# Patient Record
Sex: Female | Born: 1995 | Race: White | Hispanic: No | Marital: Single | Smoking: Never smoker
Health system: Southern US, Community
[De-identification: ages and names within clinical notes are randomized; demographics above are authoritative.]

## PROBLEM LIST (undated history)

## (undated) DIAGNOSIS — E559 Vitamin D deficiency, unspecified: Secondary | ICD-10-CM

## (undated) HISTORY — DX: Vitamin D deficiency, unspecified: E55.9

## (undated) HISTORY — PX: WISDOM TOOTH EXTRACTION: SHX21

---
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
## 2017-09-07 ENCOUNTER — Other Ambulatory Visit
Admission: RE | Admit: 2017-09-07 | Discharge: 2017-09-07 | Disposition: A | Discharge status: Home / Self Care | Payer: 59 | Source: Ambulatory Visit | Attending: Nurse Practitioner | Admitting: Nurse Practitioner

## 2017-09-07 DIAGNOSIS — R5383 Other fatigue: Secondary | ICD-10-CM | POA: Diagnosis present

## 2017-09-07 DIAGNOSIS — Z202 Contact with and (suspected) exposure to infections with a predominantly sexual mode of transmission: Secondary | ICD-10-CM | POA: Diagnosis not present

## 2017-09-07 DIAGNOSIS — D509 Iron deficiency anemia, unspecified: Secondary | ICD-10-CM | POA: Diagnosis not present

## 2017-09-07 DIAGNOSIS — Z91018 Allergy to other foods: Secondary | ICD-10-CM | POA: Diagnosis not present

## 2017-09-07 DIAGNOSIS — E559 Vitamin D deficiency, unspecified: Secondary | ICD-10-CM | POA: Diagnosis not present

## 2017-09-07 LAB — T4, FREE: FREE T4: 0.83 ng/dL (ref 0.61–1.12)

## 2017-09-07 LAB — FOLATE: Folate: 7.8 ng/mL (ref >5.9)

## 2017-09-07 LAB — VITAMIN B12: Vitamin B-12: 233 pg/mL (ref 180–914)

## 2017-09-07 LAB — LIPID PANEL
CHOL/HDL RATIO: 4.2 ratio
CHOLESTEROL: 150 mg/dL (ref 0–200)
HDL: 36 mg/dL — AB (ref >40)
LDL Cholesterol: 96 mg/dL (ref 0–99)
TRIGLYCERIDES: 89 mg/dL (ref <150)
VLDL: 18 mg/dL (ref 0–40)

## 2017-09-07 LAB — CBC
HEMATOCRIT: 45.8 % (ref 35.0–47.0)
Hemoglobin: 15.3 g/dL (ref 12.0–16.0)
MCH: 29.2 pg (ref 26.0–34.0)
MCHC: 33.4 g/dL (ref 32.0–36.0)
MCV: 87.6 fL (ref 80.0–100.0)
Platelets: 353 10*3/uL (ref 150–440)
RBC: 5.23 MIL/uL — ABNORMAL HIGH (ref 3.80–5.20)
RDW: 13.1 % (ref 11.5–14.5)
WBC: 9.8 10*3/uL (ref 3.6–11.0)

## 2017-09-07 LAB — COMPREHENSIVE METABOLIC PANEL
ALT: 34 U/L (ref 14–54)
ANION GAP: 11 (ref 5–15)
AST: 26 U/L (ref 15–41)
Albumin: 4 g/dL (ref 3.5–5.0)
Alkaline Phosphatase: 104 U/L (ref 38–126)
BILIRUBIN TOTAL: 0.5 mg/dL (ref 0.3–1.2)
BUN: 14 mg/dL (ref 6–20)
CO2: 22 mmol/L (ref 22–32)
Calcium: 8.8 mg/dL — ABNORMAL LOW (ref 8.9–10.3)
Chloride: 105 mmol/L (ref 101–111)
Creatinine, Ser: 0.78 mg/dL (ref 0.44–1.00)
Glucose, Bld: 90 mg/dL (ref 65–99)
POTASSIUM: 4.4 mmol/L (ref 3.5–5.1)
Sodium: 138 mmol/L (ref 135–145)
TOTAL PROTEIN: 7.1 g/dL (ref 6.5–8.1)

## 2017-09-07 LAB — TSH: TSH: 4.856 u[IU]/mL — ABNORMAL HIGH (ref 0.350–4.500)

## 2017-09-07 LAB — FERRITIN: FERRITIN: 37 ng/mL (ref 11–307)

## 2017-09-08 LAB — HIV ANTIBODY (ROUTINE TESTING W REFLEX): HIV Screen 4th Generation wRfx: NONREACTIVE

## 2017-09-08 LAB — RPR: RPR: NONREACTIVE

## 2017-09-10 ENCOUNTER — Other Ambulatory Visit
Admission: RE | Admit: 2017-09-10 | Discharge: 2017-09-10 | Disposition: A | Discharge status: Home / Self Care | Payer: 59 | Source: Ambulatory Visit | Attending: Nurse Practitioner | Admitting: Nurse Practitioner

## 2017-09-10 DIAGNOSIS — Z91018 Allergy to other foods: Secondary | ICD-10-CM | POA: Diagnosis present

## 2017-09-10 LAB — ALLERGENS(10) FOODS
F082-IgE Cheese, Mold Type: <0.1 kU/L
F245-Ige Egg, Whole: <0.1 kU/L
Milk IgE: <0.1 kU/L
Peanut IgE: <0.1 kU/L
Wheat IgE: <0.1 kU/L

## 2017-09-10 LAB — GLIA (IGA/G) + TTG IGA
ANTIGLIADIN ABS, IGA: 17 U (ref 0–19)
GLIADIN IGG: 2 U (ref 0–19)
Tissue Transglutaminase Ab, IgA: <2 U/mL (ref 0–3)

## 2017-09-10 LAB — HSV 1 AND 2 IGM ABS, INDIRECT: HSV 1 IgM Antibodies: <1:10 {titer}

## 2017-09-12 LAB — MISC LABCORP TEST (SEND OUT): LABCORP TEST CODE: 164922

## 2017-09-12 LAB — VITAMIN D 25 HYDROXY (VIT D DEFICIENCY, FRACTURES): VIT D 25 HYDROXY: 7.7 ng/mL — AB (ref 30.0–100.0)

## 2017-09-13 LAB — MISC LABCORP TEST (SEND OUT): Labcorp test code: 602989

## 2017-09-19 LAB — MISC LABCORP TEST (SEND OUT): LABCORP TEST CODE: 602628

## 2017-12-13 ENCOUNTER — Ambulatory Visit (INDEPENDENT_AMBULATORY_CARE_PROVIDER_SITE_OTHER): Payer: 59 | Admitting: Nurse Practitioner

## 2017-12-13 ENCOUNTER — Encounter: Payer: Self-pay | Admitting: Nurse Practitioner

## 2017-12-13 ENCOUNTER — Other Ambulatory Visit: Payer: Self-pay | Admitting: Nurse Practitioner

## 2017-12-13 ENCOUNTER — Other Ambulatory Visit: Payer: Self-pay | Admitting: Internal Medicine

## 2017-12-13 VITALS — BP 141/84 | HR 90 | Resp 16 | Ht 67.0 in | Wt 346.0 lb

## 2017-12-13 DIAGNOSIS — R300 Dysuria: Secondary | ICD-10-CM

## 2017-12-13 DIAGNOSIS — E559 Vitamin D deficiency, unspecified: Secondary | ICD-10-CM | POA: Diagnosis not present

## 2017-12-13 DIAGNOSIS — Z124 Encounter for screening for malignant neoplasm of cervix: Secondary | ICD-10-CM | POA: Diagnosis not present

## 2017-12-13 DIAGNOSIS — Z0001 Encounter for general adult medical examination with abnormal findings: Secondary | ICD-10-CM | POA: Diagnosis not present

## 2017-12-13 NOTE — Progress Notes (Signed)
[facility] [place], [place] [address]  Internal MEDICINE  Office Visit Note  Patient Name: Betty Fisher  [id number]  [id number]  Date of Service: 12/23/2017  No chief complaint on file.    The patient has no physical concerns or complaints today. She has normal menstrual cycles. Has been watching her diet. She has cut calorie intake and is participating in routine physical exercise.    Pt is here for routine health maintenance examination  Current Medication: Outpatient Encounter Medications as of 12/13/2017  Medication Sig  . Vitamin D, Ergocalciferol, (DRISDOL) 50000 units CAPS capsule Take 50,000 Units by mouth once a week.   No facility-administered encounter medications on file as of 12/13/2017.     Surgical History: Past Surgical History:  Procedure Laterality Date  . WISDOM TOOTH EXTRACTION      Medical History: No past medical history on file.  Family History: Family History  Problem Relation Age of Onset  . Hypertension Mother   . Hypothyroidism Mother   . Hypertension Father       Review of Systems  Constitutional: Negative for activity change, chills, fatigue and unexpected weight change.  HENT: Negative for congestion, postnasal drip, rhinorrhea, sneezing and sore throat.   Eyes: Negative.  Negative for redness.  Respiratory: Negative for cough, chest tightness and shortness of breath.   Cardiovascular: Negative for chest pain and palpitations.  Gastrointestinal: Negative for abdominal pain, constipation, diarrhea, nausea and vomiting.  Genitourinary: Negative for dysuria and frequency.       She reports her last menstrual cycle was between 3 and 4 weeks ago. It was norma.   Musculoskeletal: Negative for arthralgias, back pain, joint swelling and neck pain.  Skin: Negative for rash.  Allergic/Immunologic: Negative for environmental allergies.  Neurological: Negative.  Negative for tremors and numbness.  Hematological:  Negative for adenopathy. Does not bruise/bleed easily.  Psychiatric/Behavioral: Negative for behavioral problems (Depression), sleep disturbance and suicidal ideas. The patient is not nervous/anxious.      Today's Vitals   12/13/17 1144  BP: (!) 141/84  Pulse: 90  Resp: 16  SpO2: 98%  Weight: (!) 346 lb (156.9 kg)  Height: 5\' 7"  (1.702 m)    Physical Exam  Constitutional: She is oriented to person, place, and time. She appears well-developed and well-nourished. No distress.  HENT:  Head: Normocephalic and atraumatic.  Mouth/Throat: Oropharynx is clear and moist. No oropharyngeal exudate.  Eyes: EOM are normal. Pupils are equal, round, and reactive to light.  Neck: Normal range of motion. Neck supple. No JVD present. No tracheal deviation present. No thyromegaly present.  Cardiovascular: Normal rate, regular rhythm, normal heart sounds and intact distal pulses. Exam reveals no gallop and no friction rub.  No murmur heard. Pulmonary/Chest: Effort normal and breath sounds normal. No respiratory distress. She has no wheezes. She has no rales. She exhibits no tenderness. Right breast exhibits no inverted nipple, no mass, no nipple discharge, no skin change and no tenderness. Left breast exhibits no inverted nipple, no mass, no nipple discharge, no skin change and no tenderness.  Abdominal: Soft. Bowel sounds are normal. There is no tenderness. Hernia confirmed negative in the right inguinal area and confirmed negative in the left inguinal area.  Genitourinary: Rectum normal. Pelvic exam was performed with patient supine. There is no rash, tenderness, lesion or injury on the right labia. There is no rash, tenderness, lesion or injury on the left labia. Uterus is not deviated, not enlarged, not fixed and  not tender. Cervix exhibits no motion tenderness, no discharge and no friability. Right adnexum displays no mass, no tenderness and no fullness. Left adnexum displays no mass, no tenderness and no  fullness. No erythema, tenderness or bleeding in the vagina. No foreign body in the vagina. No signs of injury around the vagina. No vaginal discharge found.  Genitourinary Comments: There is no tenderness, masses, or organomegay present during bimanual exam.   Musculoskeletal: Normal range of motion.  Lymphadenopathy:    She has no cervical adenopathy.       Right: No inguinal adenopathy present.       Left: No inguinal adenopathy present.  Neurological: She is alert and oriented to person, place, and time. No cranial nerve deficit.  Skin: Skin is warm and dry. She is not diaphoretic.  Psychiatric: She has a normal mood and affect. Her behavior is normal. Judgment and thought content normal.  Nursing note and vitals reviewed.    LABS: Recent Results (from the past 2160 hour(s))  Urinalysis, Routine w reflex microscopic     Status: Abnormal   Collection Time: 12/13/17 11:40 AM  Result Value Ref Range   Specific Gravity, UA 1.024 1.005 - 1.030   pH, UA 7.0 5.0 - 7.5   Color, UA Yellow Yellow   Appearance Ur Clear Clear   Leukocytes, UA 1+ (A) Negative   Protein, UA Negative Negative/Trace   Glucose, UA Negative Negative   Ketones, UA Negative Negative   RBC, UA Negative Negative   Bilirubin, UA Negative Negative   Urobilinogen, Ur 0.2 0.2 - 1.0 mg/dL   Nitrite, UA Negative Negative   Microscopic Examination See below:     Comment: Microscopic was indicated and was performed.  Microscopic Examination     Status: Abnormal   Collection Time: 12/13/17 11:40 AM  Result Value Ref Range   WBC, UA 0-5 0 - 5 /hpf   RBC, UA 0-2 0 - 2 /hpf   Epithelial Cells (non renal) >10 (A) 0 - 10 /hpf   Casts None seen None seen /lpf   Mucus, UA Present Not Estab.   Bacteria, UA None seen None seen/Few  Pap IG and HPV (high risk) DNA detection     Status: Abnormal   Collection Time: 12/13/17 11:41 AM  Result Value Ref Range   DIAGNOSIS: Comment (A)     Comment: EPITHELIAL CELL  ABNORMALITY. ATYPICAL SQUAMOUS CELLS OF UNDETERMINED SIGNIFICANCE (ASC-US).    Specimen adequacy: Comment     Comment: Satisfactory for evaluation. No endocervical component is identified.   Clinician Provided ICD10 Comment     Comment: Z12.4   Performed by: Comment     Comment: Heloise Ochoa, Cytotechnologist (ASCP)   Electronically signed by: Comment     Comment: Franco Collet, MD, Pathologist   PAP Smear Comment .    PATHOLOGIST PROVIDED ICD10: Comment     Comment: R87.610   Note: Comment     Comment: The Pap smear is a screening test designed to aid in the detection of premalignant and malignant conditions of the uterine cervix.  It is not a diagnostic procedure and should not be used as the sole means of detecting cervical cancer.  Both false-positive and false-negative reports do occur.    Test Methodology Comment     Comment: This liquid based ThinPrep(R) pap test was screened with the use of an image guided system.    HPV, high-risk Positive (A) Negative    Comment: This high-risk HPV test detects  thirteen high-risk types (16/18/31/33/35/39/45/51/52/56/58/59/68) without differentiation.      Assessment/Plan:  1. Encounter for general adult medical examination with abnormal findings Annual health maintenance exam today  2. Morbid obesity ([place]) Discussed limiting calorie intake to 1500 calorie diet and to continue with routine exercise program.   3. Screening for malignant neoplasm of cervix - Pap IG and HPV (high risk) DNA detection  4. Dysuria - Urinalysis, Routine w reflex microscopic   General Counseling: Nykira verbalizes understanding of the findings of todays visit and agrees with plan of treatment. I have discussed any further diagnostic evaluation that may be needed or ordered today. We also reviewed her medications today. she has been encouraged to call the office with any questions or concerns that should arise related to todays visit.   This  patient was seen by Leretha Pol, FNP- C in Collaboration with Dr Lavera Guise as a part of collaborative care agreement    Orders Placed This Encounter  Procedures  . Microscopic Examination  . Urinalysis, Routine w reflex microscopic    Time spent: [place], MD  Internal Medicine

## 2017-12-14 LAB — MICROSCOPIC EXAMINATION
Bacteria, UA: NONE SEEN
CASTS: NONE SEEN /LPF

## 2017-12-14 LAB — URINALYSIS, ROUTINE W REFLEX MICROSCOPIC
Bilirubin, UA: NEGATIVE
Glucose, UA: NEGATIVE
Ketones, UA: NEGATIVE
Nitrite, UA: NEGATIVE
PH UA: 7 (ref 5.0–7.5)
PROTEIN UA: NEGATIVE
RBC, UA: NEGATIVE
Specific Gravity, UA: 1.024 (ref 1.005–1.030)
Urobilinogen, Ur: 0.2 mg/dL (ref 0.2–1.0)

## 2017-12-17 ENCOUNTER — Emergency Department
Admission: EM | Admit: 2017-12-17 | Discharge: 2017-12-17 | Disposition: A | Discharge status: Home / Self Care | Payer: 59 | Attending: Emergency Medicine | Admitting: Emergency Medicine

## 2017-12-17 ENCOUNTER — Encounter: Payer: Self-pay | Admitting: Emergency Medicine

## 2017-12-17 ENCOUNTER — Other Ambulatory Visit: Payer: Self-pay

## 2017-12-17 ENCOUNTER — Emergency Department: Payer: 59

## 2017-12-17 DIAGNOSIS — R0602 Shortness of breath: Secondary | ICD-10-CM | POA: Insufficient documentation

## 2017-12-17 DIAGNOSIS — J209 Acute bronchitis, unspecified: Secondary | ICD-10-CM | POA: Diagnosis not present

## 2017-12-17 DIAGNOSIS — R5383 Other fatigue: Secondary | ICD-10-CM | POA: Diagnosis not present

## 2017-12-17 DIAGNOSIS — R05 Cough: Secondary | ICD-10-CM | POA: Diagnosis not present

## 2017-12-17 MED ORDER — PREDNISONE 50 MG PO TABS: ORAL_TABLET | ORAL | 0 refills | Status: DC

## 2017-12-17 MED ORDER — AZITHROMYCIN 500 MG PO TABS
ORAL_TABLET | ORAL | Status: AC
  Filled 2017-12-17: qty 1

## 2017-12-17 MED ORDER — IPRATROPIUM-ALBUTEROL 0.5-2.5 (3) MG/3ML IN SOLN
3.0000 mL | Freq: Once | RESPIRATORY_TRACT | Status: AC
  Administered 2017-12-17: 3 mL via RESPIRATORY_TRACT
  Filled 2017-12-17: qty 3

## 2017-12-17 MED ORDER — AZITHROMYCIN 250 MG PO TABS: ORAL_TABLET | ORAL | 0 refills | Status: AC

## 2017-12-17 MED ORDER — AZITHROMYCIN 500 MG PO TABS
500.0000 mg | ORAL_TABLET | Freq: Once | ORAL | Status: AC
  Administered 2017-12-17: 500 mg via ORAL

## 2017-12-17 MED ORDER — PREDNISONE 20 MG PO TABS
60.0000 mg | ORAL_TABLET | Freq: Once | ORAL | Status: AC
  Administered 2017-12-17: 60 mg via ORAL
  Filled 2017-12-17: qty 3

## 2017-12-17 NOTE — ED Triage Notes (Signed)
Pt presents to ED with "deep cough" with fatigue. Onset Wednesday night. Pt states she is coughing all night and not able to get any sleep.

## 2017-12-17 NOTE — ED Provider Notes (Signed)
[facility] Emergency Department Provider Note  ____________________________________________  Time seen: Approximately 10:33 PM  I have reviewed the triage vital signs and the nursing notes.   HISTORY  Chief Complaint Fatigue and Cough    HPI Betty Fisher is a 22 y.o. female presents to the emergency department with cough, shortness of breath and fatigue after developing a productive cough for the past 5 days.  Patient reports that she is unable to sleep due to cough.  She denies chest pain, nausea and abdominal pain.  She has been afebrile at home.  No prior history of asthma.  No alleviating measures of been attempted.   History reviewed. No pertinent past medical history.  There are no active problems to display for this patient.   Past Surgical History:  Procedure Laterality Date  . WISDOM TOOTH EXTRACTION      Prior to Admission medications   Medication Sig Start Date End Date Taking? Authorizing Provider  azithromycin (ZITHROMAX Z-PAK) 250 MG tablet Take 2 tablets (500 mg) on  Day 1,  followed by 1 tablet (250 mg) once daily on Days 2 through 5. 12/17/17 12/22/17  Lannie Fields, PA-C  predniSONE (DELTASONE) 50 MG tablet Take one 50 mg tablet once a day for 5 days. 12/17/17   Lannie Fields, PA-C  Vitamin D, Ergocalciferol, (DRISDOL) 50000 units CAPS capsule Take 50,000 Units by mouth once a week. 11/30/17   [provider]    Allergies Patient has no known allergies.  Family History  Problem Relation Age of Onset  . Hypertension Mother   . Hypothyroidism Mother   . Hypertension Father     Social History Social History   Tobacco Use  . Smoking status: Never Smoker  . Smokeless tobacco: Never Used  Substance Use Topics  . Alcohol use: No    Frequency: Never  . Drug use: No     Review of Systems  Constitutional: No fever/chills Eyes: No visual changes. No discharge ENT: No upper respiratory complaints. Cardiovascular:  no chest pain. Respiratory: Patient has cough.  Gastrointestinal: No abdominal pain.  No nausea, no vomiting.  No diarrhea.  No constipation. Musculoskeletal: Negative for musculoskeletal pain. Skin: Negative for rash, abrasions, lacerations, ecchymosis. Neurological: Negative for headaches, focal weakness or numbness.  ____________________________________________   PHYSICAL EXAM:  VITAL SIGNS: ED Triage Vitals [12/17/17 2019]  Enc Vitals Group     BP (!) 147/82     Pulse Rate 84     Resp 18     Temp 99 F (37.2 C)     Temp Source Oral     SpO2 98 %     Weight (!) 346 lb (156.9 kg)     Height 5\' 7"  (1.702 m)     Head Circumference      Peak Flow      Pain Score 8     Pain Loc      Pain Edu?      Excl. in [place]?      Constitutional: Alert and oriented. Well appearing and in no acute distress. Eyes: Conjunctivae are normal. PERRL. EOMI. Head: Atraumatic. ENT:      Ears: TMs are pearly.       Nose: No congestion/rhinnorhea.      Mouth/Throat: Mucous membranes are moist.  Hematological/Lymphatic/Immunilogical: No cervical lymphadenopathy.  Cardiovascular: Normal rate, regular rhythm. Normal S1 and S2.  Good peripheral circulation. Respiratory: Normal respiratory effort without tachypnea or retractions. Lungs CTAB. Good air entry to  the bases with no decreased or absent breath sounds. Musculoskeletal: Full range of motion to all extremities. No gross deformities appreciated. Neurologic:  Normal speech and language. No gross focal neurologic deficits are appreciated.  Skin:  Skin is warm, dry and intact. No rash noted.  ____________________________________________   LABS (all labs ordered are listed, but only abnormal results are displayed)  Labs Reviewed - No data to display ____________________________________________  EKG   ____________________________________________  RADIOLOGY Unk Pinto, personally viewed and evaluated these images (plain radiographs)  as part of my medical decision making, as well as reviewing the written report by the radiologist.  Dg Chest 2 View  Result Date: 12/17/2017 CLINICAL DATA:  Cough and fatigue since Wednesday evening. EXAM: CHEST  2 VIEW COMPARISON:  None. FINDINGS: The heart size and mediastinal contours are within normal limits. Both lungs are clear. The visualized skeletal structures are unremarkable. IMPRESSION: No active cardiopulmonary disease. Electronically Signed   By: Ashley Royalty M.D.   On: 12/17/2017 21:18    ____________________________________________    PROCEDURES  Procedure(s) performed:    Procedures    Medications  azithromycin (ZITHROMAX) 500 MG tablet (not administered)  ipratropium-albuterol (DUONEB) 0.5-2.5 (3) MG/3ML nebulizer solution 3 mL (3 mLs Nebulization Given 12/17/17 2204)  azithromycin (ZITHROMAX) tablet 500 mg (500 mg Oral Given 12/17/17 2219)  predniSONE (DELTASONE) tablet 60 mg (60 mg Oral Given 12/17/17 2220)     ____________________________________________   INITIAL IMPRESSION / ASSESSMENT AND PLAN / ED COURSE  Pertinent labs & imaging results that were available during my care of the patient were reviewed by me and considered in my medical decision making (see chart for details).  Review of the [place] CSRS was performed in accordance of the [place] prior to dispensing any controlled drugs.    Assessment and Plan:  Acute Bronchitis:  Patient presents to the emergency department with productive cough and fatigue for the past 5 days.  Differential diagnosis included community-acquired pneumonia versus bronchitis.  No consolidations were found on chest x-ray.  Patient was treated empirically for acute bronchitis with azithromycin and prednisone.  Vital signs are reassuring prior to discharge.  All patient questions were answered.     ____________________________________________  FINAL CLINICAL IMPRESSION(S) / ED DIAGNOSES  Final diagnoses:  Acute bronchitis,  unspecified organism      NEW MEDICATIONS STARTED DURING THIS VISIT:  ED Discharge Orders        Ordered    azithromycin (ZITHROMAX Z-PAK) 250 MG tablet     12/17/17 2202    predniSONE (DELTASONE) 50 MG tablet     12/17/17 2202          This chart was dictated using voice recognition software/Dragon. Despite best efforts to proofread, errors can occur which can change the meaning. Any change was purely unintentional.    Lannie Fields, PA-C 12/17/17 2237    Nance Pear, MD 12/17/17 2245

## 2017-12-17 NOTE — ED Notes (Signed)
Coughing especially at night since Wednesday. Unable to rest and feels very fatigued

## 2017-12-18 LAB — PAP IG AND HPV HIGH-RISK
HPV, high-risk: POSITIVE — AB
PAP Smear Comment: 0

## 2017-12-23 ENCOUNTER — Encounter: Payer: Self-pay | Admitting: Nurse Practitioner

## 2017-12-23 DIAGNOSIS — E559 Vitamin D deficiency, unspecified: Secondary | ICD-10-CM | POA: Insufficient documentation

## 2018-01-07 ENCOUNTER — Ambulatory Visit: Payer: Self-pay | Admitting: Obstetrics and Gynecology

## 2018-01-16 ENCOUNTER — Emergency Department
Admission: EM | Admit: 2018-01-16 | Discharge: 2018-01-16 | Disposition: A | Discharge status: Home / Self Care | Payer: 59 | Attending: Emergency Medicine | Admitting: Emergency Medicine

## 2018-01-16 ENCOUNTER — Other Ambulatory Visit: Payer: Self-pay

## 2018-01-16 DIAGNOSIS — R05 Cough: Secondary | ICD-10-CM | POA: Diagnosis not present

## 2018-01-16 DIAGNOSIS — J101 Influenza due to other identified influenza virus with other respiratory manifestations: Secondary | ICD-10-CM | POA: Diagnosis not present

## 2018-01-16 DIAGNOSIS — R509 Fever, unspecified: Secondary | ICD-10-CM | POA: Diagnosis not present

## 2018-01-16 DIAGNOSIS — Z79899 Other long term (current) drug therapy: Secondary | ICD-10-CM | POA: Insufficient documentation

## 2018-01-16 LAB — INFLUENZA PANEL BY PCR (TYPE A & B)
INFLAPCR: POSITIVE — AB
Influenza B By PCR: NEGATIVE

## 2018-01-16 LAB — GROUP A STREP BY PCR

## 2018-01-16 MED ORDER — ACETAMINOPHEN 325 MG PO TABS
650.0000 mg | ORAL_TABLET | Freq: Once | ORAL | Status: AC | PRN
  Administered 2018-01-16: 650 mg via ORAL
  Filled 2018-01-16: qty 2

## 2018-01-16 MED ORDER — OSELTAMIVIR PHOSPHATE 75 MG PO CAPS: 75.0000 mg | ORAL_CAPSULE | Freq: Two times a day (BID) | ORAL | 0 refills | Status: DC

## 2018-01-16 NOTE — ED Provider Notes (Signed)
[facility] Emergency Department Provider Note  ____________________________________________   First MD Initiated Contact with Patient 01/16/18 2027     (approximate)  I have reviewed the triage vital signs and the nursing notes.   HISTORY  Chief Complaint Fever    HPI Betty Fisher is a 22 y.o. female who presents emergency department complaining of fever and chills.  She has had a cough and congestion.  She denies any vomiting or diarrhea.  She denies sore throat.  She recently had a upper respiratory infection in February.  She was treated with antibiotic at that time.  No past medical history on file.  Patient Active Problem List   Diagnosis Date Noted  . Morbid obesity ([place]) 12/23/2017  . Vitamin D deficiency 12/23/2017    Past Surgical History:  Procedure Laterality Date  . WISDOM TOOTH EXTRACTION      Prior to Admission medications   Medication Sig Start Date End Date Taking? Authorizing Provider  oseltamivir (TAMIFLU) 75 MG capsule Take 1 capsule (75 mg total) by mouth 2 (two) times daily. 01/16/18   Fisher, Linden Dolin, PA-C  predniSONE (DELTASONE) 50 MG tablet Take one 50 mg tablet once a day for 5 days. 12/17/17   Lannie Fields, PA-C  Vitamin D, Ergocalciferol, (DRISDOL) 50000 units CAPS capsule Take 50,000 Units by mouth once a week. 11/30/17   [provider]    Allergies Patient has no known allergies.  Family History  Problem Relation Age of Onset  . Hypertension Mother   . Hypothyroidism Mother   . Hypertension Father     Social History Social History   Tobacco Use  . Smoking status: Never Smoker  . Smokeless tobacco: Never Used  Substance Use Topics  . Alcohol use: No    Frequency: Never  . Drug use: No    Review of Systems  Constitutional: Positive fever/chills, positive for body aches Eyes: No visual changes. ENT: No sore throat. Respiratory: Positive cough Gastrointestinal: Denies vomiting or  diarrhea Genitourinary: Negative for dysuria. Musculoskeletal: Negative for back pain. Skin: Negative for rash.    ____________________________________________   PHYSICAL EXAM:  VITAL SIGNS: ED Triage Vitals [01/16/18 2007]  Enc Vitals Group     BP (!) 145/74     Pulse Rate (!) 126     Resp 20     Temp (!) 101.7 F (38.7 C)     Temp Source Oral     SpO2 97 %     Weight (!) 320 lb (145.2 kg)     Height 5\' 8"  (1.727 m)     Head Circumference      Peak Flow      Pain Score 0     Pain Loc      Pain Edu?      Excl. in [place]?     Constitutional: Alert and oriented. Well appearing and in no acute distress. Eyes: Conjunctivae are normal.  Head: Atraumatic. Nose: No congestion/rhinnorhea. Mouth/Throat: Mucous membranes are moist.  Throat is irritated Neck: Is supple, no lymphadenopathy is noted Cardiovascular: Normal rate, regular rhythm.  Heart sounds are normal Respiratory: Normal respiratory effort.  No retractions, lungs are clear to auscultation GU: deferred Musculoskeletal: FROM all extremities, warm and well perfused Neurologic:  Normal speech and language.  Skin:  Skin is warm, dry and intact. No rash noted. Psychiatric: Mood and affect are normal. Speech and behavior are normal.  ____________________________________________   LABS (all labs ordered are listed, but only  abnormal results are displayed)  Labs Reviewed  INFLUENZA PANEL BY PCR (TYPE A & B) - Abnormal; Notable for the following components:      Result Value   Influenza A By PCR POSITIVE (*)    All other components within normal limits  GROUP A STREP BY PCR   ____________________________________________   ____________________________________________  RADIOLOGY    ____________________________________________   PROCEDURES  Procedure(s) performed: No  Procedures    ____________________________________________   INITIAL IMPRESSION / ASSESSMENT AND PLAN / ED COURSE  Pertinent labs &  imaging results that were available during my care of the patient were reviewed by me and considered in my medical decision making (see chart for details).  Patient is a 22 year old female present emergency department complaining of fever and chills with body aches.  Symptoms started yesterday  On physical exam she appears nontoxic.  She is febrile.  The throat is mildly irritated.  But the remainder of the exam is benign  Flu test is positive for influenza A Strep test was inconclusive  Test results were discussed with patient.  She is given a prescription for Tamiflu.  Explained to her the Tamiflu does not cure the flu but it will help with symptoms.  Is up to her whether she has this medication or not.  She can also take over-the-counter TheraFlu.  She is to take Tylenol and ibuprofen to reduce her fever.  She is contagious until she has not had a fever for 24-48 hours.  Patient states she understands.  She see her regular doctor if not better in 3-5 days.  She is to return emergency department if worsening.  She was discharged in stable condition     As part of my medical decision making, I reviewed the following data within the [facility] notes reviewed and incorporated, Labs reviewed flu test is positive for A, strep test, Notes from prior ED visits and [place] Controlled Substance Database  ____________________________________________   FINAL CLINICAL IMPRESSION(S) / ED DIAGNOSES  Final diagnoses:  Influenza A      NEW MEDICATIONS STARTED DURING THIS VISIT:  New Prescriptions   OSELTAMIVIR (TAMIFLU) 75 MG CAPSULE    Take 1 capsule (75 mg total) by mouth 2 (two) times daily.     Note:  This document was prepared using Dragon voice recognition software and may include unintentional dictation errors.    Versie [place], PA-C 01/16/18 2303    Nance Pear, MD 01/16/18 2330

## 2018-01-16 NOTE — ED Triage Notes (Signed)
Patient to ER with c/o fever at home (102) with cough. Patient in no acute distress in triage.

## 2018-01-16 NOTE — Discharge Instructions (Signed)
Follow-up with your regular doctor if not better in 3-5 days.  Return emergency department if worsening.  Drink plenty of fluids.  Take Tylenol and ibuprofen as needed.  Over-the-counter TheraFlu will also help

## 2018-01-28 ENCOUNTER — Ambulatory Visit: Payer: Self-pay | Admitting: Obstetrics and Gynecology

## 2018-02-07 ENCOUNTER — Ambulatory Visit (INDEPENDENT_AMBULATORY_CARE_PROVIDER_SITE_OTHER): Payer: 59 | Admitting: Obstetrics and Gynecology

## 2018-02-07 ENCOUNTER — Encounter: Payer: Self-pay | Admitting: Obstetrics and Gynecology

## 2018-02-07 VITALS — BP 132/96 | HR 97 | Ht 67.0 in | Wt 338.0 lb

## 2018-02-07 DIAGNOSIS — R87610 Atypical squamous cells of undetermined significance on cytologic smear of cervix (ASC-US): Secondary | ICD-10-CM | POA: Diagnosis not present

## 2018-02-07 DIAGNOSIS — Z23 Encounter for immunization: Secondary | ICD-10-CM

## 2018-02-07 DIAGNOSIS — R87810 Cervical high risk human papillomavirus (HPV) DNA test positive: Secondary | ICD-10-CM

## 2018-02-07 NOTE — Progress Notes (Signed)
Obstetrics & Gynecology Office Visit   Chief Complaint:  Chief Complaint  Patient presents with  . Referred by [facility]    Abnormal  Pap    History of Present Illness:Betty Fisher is a 23 y.o. woman who presents today for consultation at the request of [name], MD [place] regarding recent pap smear results. Last pap obtained on 12/13/2017 revealed ASCUS HPV positive.  This was the patient's first pap.  The patient became sexually active in the last year.  So this likely represents a recent HPV exposure.  We discussed that for patient's under 24 year of age HPV is common, and that most individuals with a healthy immune system will clear HPV.   There is currently no FDA approved therapies to promote clearance of her current HPV infection, there is HPV vaccination options to prevent subsequent infection.  She has previously received 1 shot of the 3 shot Gardasil series and is interested in finishing the series  Review of Systems: Review of systems negative unless otherwise noted in HPI  Past Medical History:  No past medical history on file.  Past Surgical History:  Past Surgical History:  Procedure Laterality Date  . WISDOM TOOTH EXTRACTION      Gynecologic History: Patient's last menstrual period was 01/27/2018 (exact date).  Obstetric History: No obstetric history on file.  Family History:  Family History  Problem Relation Age of Onset  . Hypertension Mother   . Hypothyroidism Mother   . Hypertension Father     Social History:  Social History   Socioeconomic History  . Marital status: Single    Spouse name: Not on file  . Number of children: Not on file  . Years of education: Not on file  . Highest education level: Not on file  Occupational History  . Not on file  Social Needs  . Financial resource strain: Not on file  . Food insecurity:    Worry: Not on file    Inability: Not on file  . Transportation needs:    Medical: Not on file   Non-medical: Not on file  Tobacco Use  . Smoking status: Never Smoker  . Smokeless tobacco: Never Used  Substance and Sexual Activity  . Alcohol use: No    Frequency: Never  . Drug use: No  . Sexual activity: Not Currently    Birth control/protection: None  Lifestyle  . Physical activity:    Days per week: Not on file    Minutes per session: Not on file  . Stress: Not on file  Relationships  . Social connections:    Talks on phone: Not on file    Gets together: Not on file    Attends religious service: Not on file    Active member of club or organization: Not on file    Attends meetings of clubs or organizations: Not on file    Relationship status: Not on file  . Intimate partner violence:    Fear of current or ex partner: Not on file    Emotionally abused: Not on file    Physically abused: Not on file    Forced sexual activity: Not on file  Other Topics Concern  . Not on file  Social History Narrative  . Not on file    Allergies:  No Known Allergies  Medications: Prior to Admission medications   Medication Sig Start Date End Date Taking? Authorizing Provider  oseltamivir (TAMIFLU) 75 MG capsule Take 1  capsule (75 mg total) by mouth 2 (two) times daily. 01/16/18   Fisher, Linden Dolin, PA-C  predniSONE (DELTASONE) 50 MG tablet Take one 50 mg tablet once a day for 5 days. 12/17/17   Lannie Fields, PA-C  Vitamin D, Ergocalciferol, (DRISDOL) 50000 units CAPS capsule Take 50,000 Units by mouth once a week. 11/30/17   [provider]    Physical Exam Vitals:  Vitals:   02/07/18 1408  BP: (!) 132/96  Pulse: 97   Patient's last menstrual period was 01/27/2018 (exact date).  General: NAD HEENT: normocephalic, anicteric Pulmonary: No increased work of breathing Neurologic: Grossly intact Psychiatric: mood appropriate, affect full    Assessment: 22 y.o. with ASCUS HPV positive pap  Plan: Problem List Items Addressed This Visit    None    Visit Diagnoses     Need for HPV vaccine    -  Primary   Relevant Orders   HPV 9-valent vaccine,Recombinat (Completed)   ASCUS with positive high risk HPV cervical          - Follow up pap smear in 12 months  - I had a lengthly discussion with Betty Fisher  regarding the cause of dysplasia of the lower genital tract (including immunosuppression in the setting of HPV exposure and tobacco exposure). I explained the potential for progression to invasive malignancy, the recurrent nature of these lesions (and the need for close continued followup). Per ASCCP guidelines her pap results is triaged for repeat cytology in 12 months.  - She is comfortable with the plan and had her questions answered.  - completed 1 of 3 shots in Gardasil series will pick up series  - Return in about 1 year (around 02/08/2019) for 3 month gardasil, 1 year annual.   Malachy Mood, MD, [place] 02/08/2018, 12:48 PM

## 2018-02-07 NOTE — Patient Instructions (Signed)
Pt state she had first dose at age 22. This is her second dose per AMS

## 2018-02-25 ENCOUNTER — Other Ambulatory Visit: Payer: Self-pay

## 2018-02-26 ENCOUNTER — Other Ambulatory Visit: Payer: Self-pay

## 2018-02-26 MED ORDER — ERGOCALCIFEROL 1.25 MG (50000 UT) PO CAPS: 50000.0000 [IU] | ORAL_CAPSULE | ORAL | 5 refills | Status: DC

## 2018-05-14 ENCOUNTER — Other Ambulatory Visit: Payer: Self-pay | Admitting: Nurse Practitioner

## 2018-05-14 MED ORDER — ERGOCALCIFEROL 1.25 MG (50000 UT) PO CAPS: 50000.0000 [IU] | ORAL_CAPSULE | ORAL | 1 refills | Status: DC

## 2018-06-07 ENCOUNTER — Ambulatory Visit: Payer: 59

## 2018-06-12 ENCOUNTER — Ambulatory Visit: Payer: 59

## 2018-07-08 ENCOUNTER — Emergency Department: Payer: 59

## 2018-07-08 ENCOUNTER — Emergency Department
Admission: EM | Admit: 2018-07-08 | Discharge: 2018-07-08 | Disposition: A | Discharge status: Home / Self Care | Payer: 59 | Attending: Emergency Medicine | Admitting: Emergency Medicine

## 2018-07-08 ENCOUNTER — Encounter: Payer: Self-pay | Admitting: *Deleted

## 2018-07-08 ENCOUNTER — Other Ambulatory Visit: Payer: 59

## 2018-07-08 ENCOUNTER — Other Ambulatory Visit: Payer: Self-pay

## 2018-07-08 DIAGNOSIS — J069 Acute upper respiratory infection, unspecified: Secondary | ICD-10-CM | POA: Insufficient documentation

## 2018-07-08 DIAGNOSIS — R05 Cough: Secondary | ICD-10-CM | POA: Diagnosis not present

## 2018-07-08 DIAGNOSIS — B9789 Other viral agents as the cause of diseases classified elsewhere: Secondary | ICD-10-CM

## 2018-07-08 MED ORDER — BENZONATATE 100 MG PO CAPS: ORAL_CAPSULE | ORAL | 0 refills | Status: DC

## 2018-07-08 MED ORDER — DEXAMETHASONE SODIUM PHOSPHATE 10 MG/ML IJ SOLN
10.0000 mg | Freq: Once | INTRAMUSCULAR | Status: DC
  Filled 2018-07-08: qty 1

## 2018-07-08 MED ORDER — DEXAMETHASONE SODIUM PHOSPHATE 10 MG/ML IJ SOLN
16.0000 mg | Freq: Once | INTRAMUSCULAR | Status: AC
  Administered 2018-07-08: 16 mg

## 2018-07-08 MED ORDER — PREDNISONE 10 MG (21) PO TBPK: ORAL_TABLET | ORAL | 0 refills | Status: DC

## 2018-07-08 MED ORDER — IPRATROPIUM-ALBUTEROL 0.5-2.5 (3) MG/3ML IN SOLN
3.0000 mL | Freq: Once | RESPIRATORY_TRACT | Status: AC
  Administered 2018-07-08: 3 mL via RESPIRATORY_TRACT
  Filled 2018-07-08: qty 3

## 2018-07-08 MED ORDER — DEXAMETHASONE SODIUM PHOSPHATE 10 MG/ML IJ SOLN
INTRAMUSCULAR | Status: AC
  Administered 2018-07-08: 16 mg
  Filled 2018-07-08: qty 1

## 2018-07-08 MED ORDER — AZITHROMYCIN 250 MG PO TABS: ORAL_TABLET | ORAL | 0 refills | Status: DC

## 2018-07-08 NOTE — ED Triage Notes (Signed)
Pt c/o cough x 2 weeks, non-productive, chest pressure, shortness of breath. Pt denies fever but c/o intermittent diaphoresis. Pt denies relief w/ otc meds. Pt has not seen a doctor regarding her complaint today.

## 2018-07-08 NOTE — Discharge Instructions (Addendum)
Your exam is consistent with a viral bronchitis. Take the prescription meds as directed. Drink plenty of fluids to promote cough. Follow-up with your provider for ongoing symptoms. Continue OTC Mucinex to promote productive cough, and Delsym to quiet your cough.

## 2018-07-08 NOTE — ED Provider Notes (Signed)
[facility] Emergency Department Provider Note ____________________________________________  Time seen: 2015  I have reviewed the triage vital signs and the nursing notes.  HISTORY  Chief Complaint  Cough  HPI Betty Fisher is a 22 y.o. female presents herself to the ED for evaluation of a 2-week complaint of intermittent, nonproductive cough.  She describes chest pressure as well as some shortness of breath, but denies any fevers, chills, or sweats.  Patient has dose over-the-counter medication but denies any significant benefit.  She has not seen any other provider for symptoms since onset.  History reviewed. No pertinent past medical history.  Patient Active Problem List   Diagnosis Date Noted  . Morbid obesity ([place]) 12/23/2017  . Vitamin D deficiency 12/23/2017    Past Surgical History:  Procedure Laterality Date  . WISDOM TOOTH EXTRACTION      Prior to Admission medications   Medication Sig Start Date End Date Taking? Authorizing Provider  azithromycin (ZITHROMAX Z-PAK) 250 MG tablet Take 2 tablets (500 mg) on  Day 1,  followed by 1 tablet (250 mg) once daily on Days 2 through 5. 07/08/18   Jamus Loving, Dannielle Karvonen, PA-C  benzonatate (TESSALON PERLES) 100 MG capsule Take 1-2 tabs TID prn cough 07/08/18   Sascha Baugher, Dannielle Karvonen, PA-C  cholecalciferol (VITAMIN D) 1000 units tablet Take 1,000 Units by mouth daily.    [provider]  ergocalciferol (VITAMIN D2) 50000 units capsule Take 1 capsule (50,000 Units total) by mouth once a week. 05/14/18   Ronnell Freshwater, NP  predniSONE (STERAPRED UNI-PAK 21 TAB) 10 MG (21) TBPK tablet 6-day taper as directed. 07/08/18   Lott Seelbach, Dannielle Karvonen, PA-C    Allergies Patient has no known allergies.  Family History  Problem Relation Age of Onset  . Hypertension Mother   . Hypothyroidism Mother   . Hypertension Father     Social History Social History   Tobacco Use  . Smoking status: Never Smoker  .  Smokeless tobacco: Never Used  Substance Use Topics  . Alcohol use: No    Frequency: Never  . Drug use: No    Review of Systems  Constitutional: Negative for fever. Eyes: Negative for visual changes. ENT: Negative for sore throat. Cardiovascular: Negative for chest pain. Respiratory: positive for shortness of breath and non-productive cough. Gastrointestinal: Negative for abdominal pain, vomiting and diarrhea. Genitourinary: Negative for dysuria. Musculoskeletal: Negative for back pain. Skin: Negative for rash. Neurological: Negative for headaches, focal weakness or numbness. ____________________________________________  PHYSICAL EXAM:  VITAL SIGNS: ED Triage Vitals  Enc Vitals Group     BP 07/08/18 1952 (!) 151/81     Pulse Rate 07/08/18 1952 82     Resp 07/08/18 1952 18     Temp 07/08/18 1952 98.3 F (36.8 C)     Temp Source 07/08/18 1952 Oral     SpO2 07/08/18 1952 98 %     Weight 07/08/18 1955 (!) 330 lb (149.7 kg)     Height 07/08/18 1955 5\' 7"  (1.702 m)     Head Circumference --      Peak Flow --      Pain Score 07/08/18 1959 6     Pain Loc --      Pain Edu? --      Excl. in [place]? --     Constitutional: Alert and oriented. Well appearing and in no distress. Head: Normocephalic and atraumatic. Eyes: Conjunctivae are normal. PERRL. Normal extraocular movements Ears: Canals clear.  TMs intact bilaterally. Nose: No congestion/rhinorrhea/epistaxis. Mouth/Throat: Mucous membranes are moist. Hoarse voice. Uvula is midline and tonsils are flat. Neck: Supple. No thyromegaly. Hematological/Lymphatic/Immunological: No cervical lymphadenopathy. Cardiovascular: Normal rate, regular rhythm. Normal distal pulses. Respiratory: Normal respiratory effort. No wheezes/rales. Mild rhonchi noted bilaterally Gastrointestinal: Soft and nontender. No distention. Musculoskeletal: Nontender with normal range of motion in all extremities.  Neurologic:  Normal gait without ataxia. Normal  speech and language. No gross focal neurologic deficits are appreciated. Skin:  Skin is warm, dry and intact. No rash noted. ____________________________________________   RADIOLOGY  CXR IMPRESSION: No acute abnormalities. ____________________________________________  PROCEDURES  Procedures Decadron 16 mg PO DuoNeb x 1 ____________________________________________  INITIAL IMPRESSION / ASSESSMENT AND PLAN / ED COURSE  Patient with ED evaluation of a 2-week complaint of a non-productive cough. She is reassured by her negative CXR. She notes improvement following her breathing treatment. Her symptoms represent a likely viral etiology, but will be treated empirically for a possible subacute infection. She will be discharged with a prescription for azithromycin, Tessalon Perles and prednisone. She will continue her home OTC meds. She will follow-up with her provider or [facility] for ongoing symptoms. ____________________________________________  FINAL CLINICAL IMPRESSION(S) / ED DIAGNOSES  Final diagnoses:  Viral URI with cough      Koen Antilla, Dannielle Karvonen, PA-C 07/09/18 1816    Eula Listen, MD 07/11/18 [phone]

## 2018-10-01 ENCOUNTER — Encounter: Payer: Self-pay | Admitting: Emergency Medicine

## 2018-10-01 ENCOUNTER — Emergency Department
Admission: EM | Admit: 2018-10-01 | Discharge: 2018-10-01 | Disposition: A | Discharge status: Home / Self Care | Payer: 59 | Attending: Emergency Medicine | Admitting: Emergency Medicine

## 2018-10-01 ENCOUNTER — Other Ambulatory Visit: Payer: Self-pay

## 2018-10-01 DIAGNOSIS — R112 Nausea with vomiting, unspecified: Secondary | ICD-10-CM | POA: Diagnosis not present

## 2018-10-01 DIAGNOSIS — R509 Fever, unspecified: Secondary | ICD-10-CM | POA: Diagnosis not present

## 2018-10-01 DIAGNOSIS — J029 Acute pharyngitis, unspecified: Secondary | ICD-10-CM | POA: Insufficient documentation

## 2018-10-01 DIAGNOSIS — R070 Pain in throat: Secondary | ICD-10-CM | POA: Diagnosis not present

## 2018-10-01 LAB — GROUP A STREP BY PCR: Group A Strep by PCR: NOT DETECTED

## 2018-10-01 MED ORDER — ONDANSETRON 4 MG PO TBDP
4.0000 mg | ORAL_TABLET | Freq: Once | ORAL | Status: AC
  Administered 2018-10-01: 4 mg via ORAL
  Filled 2018-10-01: qty 1

## 2018-10-01 MED ORDER — AMOXICILLIN 500 MG PO CAPS
500.0000 mg | ORAL_CAPSULE | Freq: Once | ORAL | Status: AC
  Administered 2018-10-01: 500 mg via ORAL
  Filled 2018-10-01: qty 1

## 2018-10-01 MED ORDER — AMOXICILLIN 500 MG PO TABS: 500.0000 mg | ORAL_TABLET | Freq: Three times a day (TID) | ORAL | 0 refills | Status: DC

## 2018-10-01 MED ORDER — ONDANSETRON 4 MG PO TBDP: 4.0000 mg | ORAL_TABLET | Freq: Three times a day (TID) | ORAL | 0 refills | Status: DC | PRN

## 2018-10-01 NOTE — ED Provider Notes (Signed)
[facility] Emergency Department Provider Note  ____________________________________________  Time seen: Approximately 9:42 PM  I have reviewed the triage vital signs and the nursing notes.   HISTORY  Chief Complaint Sore Throat   HPI Betty Fisher is a 22 y.o. female who presents to the emergency department for treatment and evaluation of sore throat.  Symptoms started 2 days ago.  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  She is unsure if she came into contact with someone with strep throat.  She states that she has had a fever, nausea without vomiting, and increasingly sore throat over 2 days.  No relief with over-the-counter medications.  No cough or other symptoms of concern.   History reviewed. No pertinent past medical history.  Patient Active Problem List   Diagnosis Date Noted  . Morbid obesity ([place]) 12/23/2017  . Vitamin D deficiency 12/23/2017    Past Surgical History:  Procedure Laterality Date  . WISDOM TOOTH EXTRACTION      Prior to Admission medications   Medication Sig Start Date End Date Taking? Authorizing Provider  amoxicillin (AMOXIL) 500 MG tablet Take 1 tablet (500 mg total) by mouth 3 (three) times daily. 10/01/18   Kallum Jorgensen, Johnette Abraham B, FNP  azithromycin (ZITHROMAX Z-PAK) 250 MG tablet Take 2 tablets (500 mg) on  Day 1,  followed by 1 tablet (250 mg) once daily on Days 2 through 5. 07/08/18   Menshew, Dannielle Karvonen, PA-C  benzonatate (TESSALON PERLES) 100 MG capsule Take 1-2 tabs TID prn cough 07/08/18   Menshew, Dannielle Karvonen, PA-C  cholecalciferol (VITAMIN D) 1000 units tablet Take 1,000 Units by mouth daily.    [provider]  ergocalciferol (VITAMIN D2) 50000 units capsule Take 1 capsule (50,000 Units total) by mouth once a week. 05/14/18   Ronnell Freshwater, NP  ondansetron (ZOFRAN-ODT) 4 MG disintegrating tablet Take 1 tablet (4 mg total) by mouth every 8 (eight) hours  as needed for nausea or vomiting. 10/01/18   Elias Dennington B, FNP  predniSONE (STERAPRED UNI-PAK 21 TAB) 10 MG (21) TBPK tablet 6-day taper as directed. 07/08/18   Menshew, Dannielle Karvonen, PA-C    Allergies Patient has no known allergies.  Family History  Problem Relation Age of Onset  . Hypertension Mother   . Hypothyroidism Mother   . Hypertension Father     Social History Social History   Tobacco Use  . Smoking status: Never Smoker  . Smokeless tobacco: Never Used  Substance Use Topics  . Alcohol use: No    Frequency: Never  . Drug use: No    Review of Systems Constitutional: Positive for fever/chills.  Normal appetite. ENT: Positive for sore throat. Cardiovascular: Denies chest pain. Respiratory: Negative for shortness of breath.  Negative for cough.  No wheezing.  Gastrointestinal: Positive for nausea, no vomiting.  No diarrhea.  Musculoskeletal: Negative for body aches Skin: Negative for rash. Neurological: Positive for headaches ____________________________________________   PHYSICAL EXAM:  VITAL SIGNS: ED Triage Vitals  Enc Vitals Group     BP 10/01/18 1947 (!) 149/93     Pulse Rate 10/01/18 1947 91     Resp --      Temp 10/01/18 1947 99.5 F (37.5 C)     Temp Source 10/01/18 1947 Oral     SpO2 10/01/18 1947 98 %     Weight 10/01/18 1948 250 lb (113.4 kg)     Height 10/01/18 1948  5\' 7"  (1.702 m)     Head Circumference --      Peak Flow --      Pain Score 10/01/18 1949 8     Pain Loc --      Pain Edu? --      Excl. in [place]? --     Constitutional: Alert and oriented.  Acutely ill appearing and in no acute distress. Eyes: Conjunctivae are normal. Ears: Bilateral tympanic membranes are normal Nose: No sinus congestion noted; no rhinnorhea. Mouth/Throat: Mucous membranes are moist.  Oropharynx erythematous. Tonsils 1+ with exudate. Uvula midline. Neck: No stridor.  Lymphatic: Bilateral anterior cervical lymphadenopathy. Cardiovascular: Normal rate,  regular rhythm. Good peripheral circulation. Respiratory: Respirations are even and unlabored.  No retractions.  Breath sounds clear to auscultation. Gastrointestinal: Soft and nontender.  Musculoskeletal: FROM x 4 extremities.  Neurologic:  Normal speech and language. Skin:  Skin is warm, dry and intact. No rash noted. Psychiatric: Mood and affect are normal. Speech and behavior are normal.  ____________________________________________   LABS (all labs ordered are listed, but only abnormal results are displayed)  Labs Reviewed  GROUP A STREP BY PCR   ____________________________________________  EKG  Not indicated ____________________________________________  RADIOLOGY  Not indicated ____________________________________________   PROCEDURES  Procedure(s) performed: None  Critical Care performed: No ____________________________________________   INITIAL IMPRESSION / ASSESSMENT AND PLAN / ED COURSE  22 y.o. female who presents to the emergency department for treatment and evaluation of 2 days of sore throat.  Although the strep test is negative, her tonsils are erythematous with exudate.  She will be treated with amoxicillin.  She will be given Zofran for the nausea.  She is to follow-up with primary care for symptoms that are not improving over the next couple of days.  She was instructed to take Tylenol or ibuprofen for pain or fever.  She is advised to return to the emergency department for symptoms of change or worsen if she is unable to schedule an appointment.    Medications  amoxicillin (AMOXIL) capsule 500 mg (500 mg Oral Given 10/01/18 2151)  ondansetron (ZOFRAN-ODT) disintegrating tablet 4 mg (4 mg Oral Given 10/01/18 2151)    ED Discharge Orders         Ordered    amoxicillin (AMOXIL) 500 MG tablet  3 times daily     10/01/18 2151    ondansetron (ZOFRAN-ODT) 4 MG disintegrating tablet  Every 8 hours PRN     10/01/18 2151           Pertinent labs &  imaging results that were available during my care of the patient were reviewed by me and considered in my medical decision making (see chart for details).    If controlled substance prescribed during this visit, 12 month history viewed on the [place] prior to issuing an initial prescription for Schedule II or III opiod. ____________________________________________   FINAL CLINICAL IMPRESSION(S) / ED DIAGNOSES  Final diagnoses:  Exudative pharyngitis    Note:  This document was prepared using Dragon voice recognition software and may include unintentional dictation errors.     Victorino Dike, FNP 10/01/18 [place], [place], MD 10/03/18 [phone]

## 2018-10-01 NOTE — Discharge Instructions (Signed)
Please take Tylenol or ibuprofen for pain or fever.  Follow-up with your primary care provider for symptoms that are not improving over the next couple of days.  Return to the emergency department for symptoms of change or worsen if you are unable to schedule an appointment.

## 2018-10-21 ENCOUNTER — Other Ambulatory Visit: Payer: Self-pay

## 2018-10-21 MED ORDER — ERGOCALCIFEROL 1.25 MG (50000 UT) PO CAPS: 50000.0000 [IU] | ORAL_CAPSULE | ORAL | 1 refills | Status: DC

## 2018-12-16 ENCOUNTER — Encounter: Payer: Self-pay | Admitting: Nurse Practitioner

## 2019-02-21 ENCOUNTER — Other Ambulatory Visit: Payer: Self-pay

## 2019-02-21 ENCOUNTER — Ambulatory Visit (INDEPENDENT_AMBULATORY_CARE_PROVIDER_SITE_OTHER): Payer: 59

## 2019-02-21 ENCOUNTER — Telehealth: Payer: Self-pay | Admitting: Nurse Practitioner

## 2019-02-21 DIAGNOSIS — Z23 Encounter for immunization: Secondary | ICD-10-CM | POA: Diagnosis not present

## 2019-02-21 NOTE — Telephone Encounter (Signed)
Advised that patient needs an appointment, they will schedule in the future when virus is gone

## 2019-04-21 ENCOUNTER — Other Ambulatory Visit: Payer: Self-pay

## 2019-04-21 ENCOUNTER — Ambulatory Visit (INDEPENDENT_AMBULATORY_CARE_PROVIDER_SITE_OTHER): Payer: 59

## 2019-04-21 DIAGNOSIS — Z23 Encounter for immunization: Secondary | ICD-10-CM

## 2019-04-21 NOTE — Progress Notes (Signed)
Pt here for second inj of gardasil which was given IM left deltoid.  NDC# [phone]

## 2019-04-22 ENCOUNTER — Ambulatory Visit: Payer: 59

## 2019-05-19 ENCOUNTER — Telehealth: Payer: Self-pay

## 2019-05-19 NOTE — Telephone Encounter (Signed)
CONTACTED PATIENT IN REGARDS TO GAP IN CARE PER INS. LEFT VOICEMAIL FOR PT TO SCHEDULE AN APPT.

## 2019-06-10 ENCOUNTER — Telehealth: Payer: Self-pay

## 2019-06-10 NOTE — Telephone Encounter (Signed)
CONTACTED PATIENT TO SCHEDULE A OV WITH PCP PER [employer] GAP IN CARE.

## 2019-08-13 ENCOUNTER — Other Ambulatory Visit: Payer: Self-pay

## 2019-08-13 DIAGNOSIS — Z20822 Contact with and (suspected) exposure to covid-19: Secondary | ICD-10-CM

## 2019-08-15 LAB — NOVEL CORONAVIRUS, NAA: SARS-CoV-2, NAA: DETECTED — AB

## 2019-08-21 ENCOUNTER — Ambulatory Visit: Payer: 59

## 2019-08-22 ENCOUNTER — Ambulatory Visit: Payer: 59

## 2019-09-02 ENCOUNTER — Ambulatory Visit (INDEPENDENT_AMBULATORY_CARE_PROVIDER_SITE_OTHER): Payer: 59

## 2019-09-02 ENCOUNTER — Ambulatory Visit: Payer: Self-pay

## 2019-09-02 ENCOUNTER — Other Ambulatory Visit: Payer: Self-pay

## 2019-09-02 DIAGNOSIS — Z23 Encounter for immunization: Secondary | ICD-10-CM

## 2019-09-02 NOTE — Progress Notes (Signed)
Pt here for third Gardasil inj which was given IM right deltoid.  NDC# [phone]

## 2020-07-22 ENCOUNTER — Encounter: Payer: Self-pay | Admitting: Nurse Practitioner

## 2020-07-27 ENCOUNTER — Ambulatory Visit (INDEPENDENT_AMBULATORY_CARE_PROVIDER_SITE_OTHER): Payer: 59 | Admitting: Internal Medicine

## 2020-07-27 ENCOUNTER — Encounter: Payer: Self-pay | Admitting: Internal Medicine

## 2020-07-27 ENCOUNTER — Other Ambulatory Visit: Payer: Self-pay

## 2020-07-27 VITALS — BP 122/84 | HR 77 | Temp 97.1°F | Wt 367.4 lb

## 2020-07-27 DIAGNOSIS — Z124 Encounter for screening for malignant neoplasm of cervix: Secondary | ICD-10-CM | POA: Diagnosis not present

## 2020-07-27 DIAGNOSIS — N898 Other specified noninflammatory disorders of vagina: Secondary | ICD-10-CM

## 2020-07-27 DIAGNOSIS — Z0001 Encounter for general adult medical examination with abnormal findings: Secondary | ICD-10-CM | POA: Diagnosis not present

## 2020-07-27 DIAGNOSIS — T148XXA Other injury of unspecified body region, initial encounter: Secondary | ICD-10-CM

## 2020-07-27 DIAGNOSIS — Z6841 Body Mass Index (BMI) 40.0 and over, adult: Secondary | ICD-10-CM

## 2020-07-27 DIAGNOSIS — R300 Dysuria: Secondary | ICD-10-CM

## 2020-07-27 NOTE — Progress Notes (Signed)
[facility] [place], [place] [address]  Internal MEDICINE  Office Visit Note  Patient Name: Betty Fisher  [id number]  [id number]  Date of Service: 07/27/2020  Chief Complaint  Patient presents with  . Annual Exam  . Quality Metric Gaps    Tdap,hep c screen  . Leg Pain    fell 3 months ago on left shin.  went to ER told it was a hemotoma  . Hypertension    since she got covid vaccines     HPI Pt is here for routine health maintenance examination. She is c/o left ankle/shin swelling, was told she has hematoma but selling is still present after 3 months. She had an abnormal pap 3 years ago, she was referred to gyn at the time, has not had a pap smear in 2 years. Previous labs did show elevated TSH. She also had a borderline low B12. Struggles with her weight, will try keto with her mom   Current Medication: Outpatient Encounter Medications as of 07/27/2020  Medication Sig  . ibuprofen (ADVIL) 800 MG tablet Take 800 mg by mouth every 8 (eight) hours as needed.  . [DISCONTINUED] amoxicillin (AMOXIL) 500 MG tablet Take 1 tablet (500 mg total) by mouth 3 (three) times daily.  . [DISCONTINUED] azithromycin (ZITHROMAX Z-PAK) 250 MG tablet Take 2 tablets (500 mg) on  Day 1,  followed by 1 tablet (250 mg) once daily on Days 2 through 5.  . [DISCONTINUED] benzonatate (TESSALON PERLES) 100 MG capsule Take 1-2 tabs TID prn cough (Patient not taking: Reported on 07/27/2020)  . [DISCONTINUED] cholecalciferol (VITAMIN D) 1000 units tablet Take 1,000 Units by mouth daily. (Patient not taking: Reported on 07/27/2020)  . [DISCONTINUED] ergocalciferol (VITAMIN D2) 1.25 MG (50000 UT) capsule Take 1 capsule (50,000 Units total) by mouth once a week. (Patient not taking: Reported on 07/27/2020)  . [DISCONTINUED] ondansetron (ZOFRAN-ODT) 4 MG disintegrating tablet Take 1 tablet (4 mg total) by mouth every 8 (eight) hours as needed for nausea or vomiting. (Patient not taking:  Reported on 07/27/2020)  . [DISCONTINUED] predniSONE (STERAPRED UNI-PAK 21 TAB) 10 MG (21) TBPK tablet 6-day taper as directed. (Patient not taking: Reported on 07/27/2020)   No facility-administered encounter medications on file as of 07/27/2020.    Surgical History: Past Surgical History:  Procedure Laterality Date  . WISDOM TOOTH EXTRACTION      Medical History: Past Medical History:  Diagnosis Date  . Vitamin D deficiency     Family History: Family History  Problem Relation Age of Onset  . Hypertension Mother   . Hypothyroidism Mother   . Hypertension Father       Review of Systems  Constitutional: Negative for chills, diaphoresis and fatigue.  HENT: Negative for ear pain, postnasal drip and sinus pressure.   Eyes: Negative for photophobia, discharge, redness, itching and visual disturbance.  Respiratory: Negative for cough, shortness of breath and wheezing.   Cardiovascular: Negative for chest pain, palpitations and leg swelling.  Gastrointestinal: Negative for abdominal pain, constipation, diarrhea, nausea and vomiting.  Genitourinary: Negative for dysuria and flank pain.  Musculoskeletal: Negative for arthralgias, back pain, gait problem and neck pain.  Skin: Negative for color change.       Left shin swelling  Allergic/Immunologic: Negative for environmental allergies and food allergies.  Neurological: Negative for dizziness and headaches.  Hematological: Does not bruise/bleed easily.  Psychiatric/Behavioral: Negative for agitation, behavioral problems (depression) and hallucinations.     Vital Signs: BP 122/84  Pulse 77   Temp (!) 97.1 F (36.2 C)   Wt (!) 367 lb 6.4 oz (166.7 kg)   SpO2 97%   BMI 57.54 kg/m  Repeat blood pressure is 122/84  Physical Exam Constitutional:      General: She is not in acute distress.    Appearance: She is well-developed. She is not diaphoretic.  HENT:     Head: Normocephalic and atraumatic.     Mouth/Throat:      Pharynx: No oropharyngeal exudate.  Eyes:     Pupils: Pupils are equal, round, and reactive to light.  Neck:     Thyroid: No thyromegaly.     Vascular: No JVD.     Trachea: No tracheal deviation.  Cardiovascular:     Rate and Rhythm: Normal rate and regular rhythm.     Heart sounds: Normal heart sounds. No murmur heard.  No friction rub. No gallop.   Pulmonary:     Effort: Pulmonary effort is normal. No respiratory distress.     Breath sounds: No wheezing or rales.  Chest:     Chest wall: No tenderness.     Breasts:        Right: Normal.        Left: Normal.     Comments: Large size breast  Abdominal:     General: Bowel sounds are normal.     Palpations: Abdomen is soft.  Genitourinary:    General: Normal vulva.     Exam position: Lithotomy position.     Labia:        Right: No rash.        Left: No rash.      Comments: Pap smear is performed Musculoskeletal:        General: Normal range of motion.     Cervical back: Normal range of motion and neck supple.  Lymphadenopathy:     Cervical: No cervical adenopathy.  Skin:    General: Skin is warm and dry.     Findings: Lesion present.  Neurological:     Mental Status: She is alert and oriented to person, place, and time.     Cranial Nerves: No cranial nerve deficit.  Psychiatric:        Behavior: Behavior normal.        Thought Content: Thought content normal.        Judgment: Judgment normal.     Assessment/Plan: 1. Encounter for screening for malignant neoplasm of cervix Preventive health maintenance is updated  CBC with Differential/Platelet - Lipid Panel With LDL/HDL Ratio - TSH - T4, free - Comprehensive metabolic panel  2. Encounter for general adult medical examination with abnormal findings  IGP, Aptima HPV performed   3. Hematoma and contusion Swelling is still present , will get soft tissue U/S  - [place] RT LOWER EXTREM LTD SOFT TISSUE NON VASCULAR; Future - [place] RT LOWER EXTREM LTD SOFT TISSUE NON  VASCULAR  4. Vaginal discharge - NuSwab Vaginitis Plus (VG+)  5. Dysuria* - UA/M w/rflx Culture, Routine  6. BMI 50.0-59.9, adult (HCC) Obesity Counseling: Risk Assessment: An assessment of behavioral risk factors was made today and includes lack of exercise sedentary lifestyle, lack of portion control and poor dietary habits.  Risk Modification Advice: She was counseled on portion control guidelines. Restricting daily caloric intake to. 1500 The detrimental long term effects of obesity on her health and ongoing poor compliance was also discussed with the patient.  - Will discuss wt loss options on next visit  General Counseling: kimbra marcelino understanding of the findings of todays visit and agrees with plan of treatment. I have discussed any further diagnostic evaluation that may be needed or ordered today. We also reviewed her medications today. she has been encouraged to call the office with any questions or concerns that should arise related to todays visit.    Orders Placed This Encounter  Procedures  . [place] RT LOWER EXTREM LTD SOFT TISSUE NON VASCULAR  . UA/M w/rflx Culture, Routine  . CBC with Differential/Platelet  . Lipid Panel With LDL/HDL Ratio  . TSH  . T4, free  . Comprehensive metabolic panel  . NuSwab Vaginitis Plus (VG+)    No orders of the defined types were placed in this encounter.   Total time spent 25 Minutes  Time spent includes review of chart, medications, test results, and follow up plan with the patient.     Lavera Guise, MD  Internal Medicine

## 2020-07-28 ENCOUNTER — Other Ambulatory Visit
Admission: RE | Admit: 2020-07-28 | Discharge: 2020-07-28 | Disposition: A | Discharge status: Home / Self Care | Payer: 59 | Attending: Internal Medicine | Admitting: Internal Medicine

## 2020-07-28 ENCOUNTER — Encounter: Payer: Self-pay | Admitting: Internal Medicine

## 2020-07-28 DIAGNOSIS — Z0001 Encounter for general adult medical examination with abnormal findings: Secondary | ICD-10-CM | POA: Insufficient documentation

## 2020-07-28 LAB — COMPREHENSIVE METABOLIC PANEL
ALT: 34 U/L (ref 0–44)
AST: 23 U/L (ref 15–41)
Albumin: 4 g/dL (ref 3.5–5.0)
Alkaline Phosphatase: 93 U/L (ref 38–126)
Anion gap: 9 (ref 5–15)
BUN: 15 mg/dL (ref 6–20)
CO2: 26 mmol/L (ref 22–32)
Calcium: 8.7 mg/dL — ABNORMAL LOW (ref 8.9–10.3)
Chloride: 101 mmol/L (ref 98–111)
Creatinine, Ser: 0.9 mg/dL (ref 0.44–1.00)
GFR calc Af Amer: >60 mL/min (ref >60)
GFR calc non Af Amer: >60 mL/min (ref >60)
Glucose, Bld: 105 mg/dL — ABNORMAL HIGH (ref 70–99)
Potassium: 3.8 mmol/L (ref 3.5–5.1)
Sodium: 136 mmol/L (ref 135–145)
Total Bilirubin: 0.5 mg/dL (ref 0.3–1.2)
Total Protein: 6.7 g/dL (ref 6.5–8.1)

## 2020-07-28 LAB — TSH: TSH: 6.909 u[IU]/mL — ABNORMAL HIGH (ref 0.350–4.500)

## 2020-07-28 LAB — CBC
HCT: 43.1 % (ref 36.0–46.0)
Hemoglobin: 14.7 g/dL (ref 12.0–15.0)
MCH: 29.6 pg (ref 26.0–34.0)
MCHC: 34.1 g/dL (ref 30.0–36.0)
MCV: 86.7 fL (ref 80.0–100.0)
Platelets: 357 10*3/uL (ref 150–400)
RBC: 4.97 MIL/uL (ref 3.87–5.11)
RDW: 13.3 % (ref 11.5–15.5)
WBC: 7.4 10*3/uL (ref 4.0–10.5)
nRBC: 0 % (ref 0.0–0.2)

## 2020-07-28 LAB — LIPID PANEL
Cholesterol: 163 mg/dL (ref 0–200)
HDL: 45 mg/dL (ref >40)
LDL Cholesterol: 97 mg/dL (ref 0–99)
Total CHOL/HDL Ratio: 3.6 RATIO
Triglycerides: 104 mg/dL (ref <150)
VLDL: 21 mg/dL (ref 0–40)

## 2020-07-28 LAB — T4, FREE: Free T4: 0.81 ng/dL (ref 0.61–1.12)

## 2020-07-29 NOTE — Telephone Encounter (Signed)
Please discuss this message with dfk

## 2020-07-30 ENCOUNTER — Ambulatory Visit: Payer: 59

## 2020-07-30 ENCOUNTER — Other Ambulatory Visit: Payer: Self-pay

## 2020-07-30 DIAGNOSIS — M7981 Nontraumatic hematoma of soft tissue: Secondary | ICD-10-CM

## 2020-07-30 LAB — MICROSCOPIC EXAMINATION
Casts: NONE SEEN /lpf
Epithelial Cells (non renal): >10 /hpf — AB (ref 0–10)
RBC: NONE SEEN /hpf (ref 0–2)

## 2020-07-30 LAB — UA/M W/RFLX CULTURE, ROUTINE
Bilirubin, UA: NEGATIVE
Glucose, UA: NEGATIVE
Ketones, UA: NEGATIVE
Leukocytes,UA: NEGATIVE
Nitrite, UA: NEGATIVE
Protein,UA: NEGATIVE
RBC, UA: NEGATIVE
Specific Gravity, UA: 1.023 (ref 1.005–1.030)
Urobilinogen, Ur: 0.2 mg/dL (ref 0.2–1.0)
pH, UA: 6 (ref 5.0–7.5)

## 2020-07-30 LAB — NUSWAB VAGINITIS PLUS (VG+)
Atopobium vaginae: HIGH Score — AB
BVAB 2: HIGH Score — AB
Candida albicans, NAA: NEGATIVE
Candida glabrata, NAA: NEGATIVE
Chlamydia trachomatis, NAA: NEGATIVE
Megasphaera 1: HIGH Score — AB
Neisseria gonorrhoeae, NAA: NEGATIVE
Trich vag by NAA: NEGATIVE

## 2020-07-30 LAB — URINE CULTURE, REFLEX

## 2020-07-31 LAB — IGP, APTIMA HPV: HPV Aptima: POSITIVE — AB

## 2020-08-03 ENCOUNTER — Encounter: Payer: Self-pay | Admitting: Internal Medicine

## 2020-08-05 ENCOUNTER — Ambulatory Visit (INDEPENDENT_AMBULATORY_CARE_PROVIDER_SITE_OTHER): Payer: 59 | Admitting: Internal Medicine

## 2020-08-05 ENCOUNTER — Encounter: Payer: Self-pay | Admitting: Internal Medicine

## 2020-08-05 VITALS — Resp 16 | Ht 67.0 in | Wt 367.0 lb

## 2020-08-05 DIAGNOSIS — T148XXA Other injury of unspecified body region, initial encounter: Secondary | ICD-10-CM

## 2020-08-05 DIAGNOSIS — E039 Hypothyroidism, unspecified: Secondary | ICD-10-CM | POA: Diagnosis not present

## 2020-08-05 DIAGNOSIS — R87610 Atypical squamous cells of undetermined significance on cytologic smear of cervix (ASC-US): Secondary | ICD-10-CM | POA: Diagnosis not present

## 2020-08-05 DIAGNOSIS — Z6841 Body Mass Index (BMI) 40.0 and over, adult: Secondary | ICD-10-CM

## 2020-08-05 NOTE — Progress Notes (Addendum)
[facility] [place], [place] [address]  Internal MEDICINE  Telephone Visit  Patient Name: Betty Fisher  [id number]  [id number]  Date of Service: 08/11/2020  I connected with the patient at 0000 by telephone and verified the patients identity using two identifiers.   I discussed the limitations, risks, security and privacy concerns of performing an evaluation and management service by telephone and the availability of in person appointments. I also discussed with the patient that there may be a patient responsible charge related to the service.  The patient expressed understanding and agrees to proceed.    Chief Complaint  Patient presents with  . Follow-up    review labs and [place]  . Telephone Screen    [phone]  . Telephone Assessment    video  . Quality Metric Gaps    HepC,TDAP    HPI Pt is connected via video visit, her labs are normal, except elevated TSH, worse than last year, she also had u/s done for left leg swelling after injury. It did show hematoma, which is organized, there is residual swelling, she seems to be in pain and has been bothered by it  Pt is also here to discuss her pap smear    Current Medication: Outpatient Encounter Medications as of 08/05/2020  Medication Sig  . ibuprofen (ADVIL) 800 MG tablet Take 800 mg by mouth every 8 (eight) hours as needed.  Marland Kitchen levothyroxine (SYNTHROID) 50 MCG tablet Take 1 tablet (50 mcg total) by mouth daily before breakfast.   No facility-administered encounter medications on file as of 08/05/2020.    Surgical History: Past Surgical History:  Procedure Laterality Date  . WISDOM TOOTH EXTRACTION      Medical History: Past Medical History:  Diagnosis Date  . Vitamin D deficiency     Family History: Family History  Problem Relation Age of Onset  . Hypertension Mother   . Hypothyroidism Mother   . Hypertension Father     Social History   Socioeconomic History  . Marital status:  Single    Spouse name: Not on file  . Number of children: Not on file  . Years of education: Not on file  . Highest education level: Not on file  Occupational History  . Not on file  Tobacco Use  . Smoking status: Never Smoker  . Smokeless tobacco: Never Used  Vaping Use  . Vaping Use: Never used  Substance and Sexual Activity  . Alcohol use: No    Comment: occationally  . Drug use: No  . Sexual activity: Not Currently    Birth control/protection: None  Other Topics Concern  . Not on file  Social History Narrative  . Not on file   Social Determinants of Health   Financial Resource Strain:   . Difficulty of Paying Living Expenses: Not on file  Food Insecurity:   . Worried About Charity fundraiser in the Last Year: Not on file  . Ran Out of Food in the Last Year: Not on file  Transportation Needs:   . Lack of Transportation (Medical): Not on file  . Lack of Transportation (Non-Medical): Not on file  Physical Activity:   . Days of Exercise per Week: Not on file  . Minutes of Exercise per Session: Not on file  Stress:   . Feeling of Stress : Not on file  Social Connections:   . Frequency of Communication with Friends and Family: Not on file  . Frequency of Social Gatherings  with Friends and Family: Not on file  . Attends Religious Services: Not on file  . Active Member of Clubs or Organizations: Not on file  . Attends Archivist Meetings: Not on file  . Marital Status: Not on file  Intimate Partner Violence:   . Fear of Current or Ex-Partner: Not on file  . Emotionally Abused: Not on file  . Physically Abused: Not on file  . Sexually Abused: Not on file    Review of Systems  Constitutional: Negative.   Respiratory: Negative.  Negative for cough.   Cardiovascular: Negative.   Gastrointestinal: Negative.   Skin: Positive for color change.       swelling  Psychiatric/Behavioral: Negative.     Vital Signs: Resp 16   Ht 5\' 7"  (1.702 m)   Wt (!) 367  lb (166.5 kg)   BMI 57.48 kg/m    Observation/Objective: Pleasant and in no acute distess    Assessment/Plan: 1. Hypothyroidism, unspecified type New onset hypothyroidism, will start synthroid, might need u/s thyroid, will check TSH and free T4 before next follow up   - levothyroxine (SYNTHROID) 50 MCG tablet; Take 1 tablet (50 mcg total) by mouth daily before breakfast.  Dispense: 90 tablet; Refill: 0  2. Hematoma and contusion Pt has organized hematoma, will need evacuation, either derm or surgery  - Ambulatory referral to Plastic Surgery  3. BMI 50.0-59.9, adult (HCC) Encouraged wt loss   4. Atypical squamous cells of undetermined significance on cytologic smear of cervix (ASC-US) Pt chooses to see obgyn  General Counseling: Birdie Sons understanding of the findings of today's phone visit and agrees with plan of treatment. I have discussed any further diagnostic evaluation that may be needed or ordered today. We also reviewed her medications today. she has been encouraged to call the office with any questions or concerns that should arise related to todays visit.    Orders Placed This Encounter  Procedures  . TSH + free T4  . Ambulatory referral to Plastic Surgery    Meds ordered this encounter  Medications  . levothyroxine (SYNTHROID) 50 MCG tablet    Sig: Take 1 tablet (50 mcg total) by mouth daily before breakfast.    Dispense:  90 tablet    Refill:  0    Time spent:20 Minutes    Dr Lavera Guise Internal medicine

## 2020-08-11 ENCOUNTER — Other Ambulatory Visit: Payer: Self-pay

## 2020-08-11 MED ORDER — LEVOTHYROXINE SODIUM 50 MCG PO TABS: 50.0000 ug | ORAL_TABLET | Freq: Every day | ORAL | 0 refills | Status: DC

## 2020-08-11 NOTE — Telephone Encounter (Signed)
Pt called that if we can send TSH med as per Humphrey Rolls send levothyroxine  50 mcg

## 2020-08-13 ENCOUNTER — Telehealth: Payer: Self-pay

## 2020-08-13 NOTE — Telephone Encounter (Signed)
-----   Message from Lavera Guise, MD sent at 08/11/2020  8:55 PM EDT ----- Can u call her to see if she will see obgyn ( abnormal pap)and surgery for hematoma

## 2020-08-13 NOTE — Telephone Encounter (Signed)
Thank you :)

## 2020-08-17 ENCOUNTER — Other Ambulatory Visit: Payer: Self-pay

## 2020-08-17 ENCOUNTER — Ambulatory Visit (INDEPENDENT_AMBULATORY_CARE_PROVIDER_SITE_OTHER): Payer: 59 | Admitting: Surgery

## 2020-08-17 ENCOUNTER — Encounter: Payer: Self-pay | Admitting: Surgery

## 2020-08-17 VITALS — BP 154/97 | HR 75 | Temp 98.4°F | Ht 67.0 in | Wt 365.0 lb

## 2020-08-17 DIAGNOSIS — T792XXA Traumatic secondary and recurrent hemorrhage and seroma, initial encounter: Secondary | ICD-10-CM

## 2020-08-17 NOTE — Patient Instructions (Signed)
Continue to use compression to the area.  Call if you start to have problems with the area swelling again.  You should be fitted for compression stockings.

## 2020-08-17 NOTE — Progress Notes (Signed)
Patient ID: Betty Fisher, female   DOB: 03/07/1996, 24 y.o.   MRN: [id number]  Chief Complaint: Left pretibial swelling  History of Present Illness Betty Fisher is a 24 y.o. female with history of trauma to her left shin occurring last April.  She had some significant bruising and swelling which persisted.  She went to the ED at [facility] and was evaluated there with sounds like venous ultrasounds to rule out DVT, and some other x-rays to her reassurance without any intervention.  Her primary care provider performed an in-house ultrasound confirming a fluid collection persistent in the same location.  She complains of some discomfort in this leg she does a lot of prolonged standing, and is currently anticipating travel to [place] with visits of family and completion of some employment opportunity there as well.  Past Medical History Past Medical History:  Diagnosis Date  . Vitamin D deficiency       Past Surgical History:  Procedure Laterality Date  . WISDOM TOOTH EXTRACTION      No Known Allergies  Current Outpatient Medications  Medication Sig Dispense Refill  . Calcium 600-400 MG-UNIT CHEW Chew by mouth.    Marland Kitchen ibuprofen (ADVIL) 800 MG tablet Take 800 mg by mouth every 8 (eight) hours as needed.    Marland Kitchen levothyroxine (SYNTHROID) 50 MCG tablet Take 1 tablet (50 mcg total) by mouth daily before breakfast. 90 tablet 0   No current facility-administered medications for this visit.    Family History Family History  Problem Relation Age of Onset  . Hypertension Mother   . Hypothyroidism Mother   . Hypertension Father       Social History Social History   Tobacco Use  . Smoking status: Never Smoker  . Smokeless tobacco: Never Used  Vaping Use  . Vaping Use: Never used  Substance Use Topics  . Alcohol use: Yes    Comment: occationally  . Drug use: No        Review of Systems  Constitutional: Negative.   HENT: Negative.   Eyes: Negative.   Respiratory: Negative.    Cardiovascular: Negative.   Gastrointestinal: Negative.   Genitourinary: Negative.   Skin: Negative.   Neurological: Negative.   Psychiatric/Behavioral: Negative.       Physical Exam Blood pressure (!) 154/97, pulse 75, temperature 98.4 F (36.9 C), height 5\' 7"  (1.702 m), weight (!) 365 lb (165.6 kg), last menstrual period 08/17/2020, SpO2 98 %. Last Weight  Most recent update: 08/17/2020  2:28 PM   Weight  165.6 kg (365 lb)              CONSTITUTIONAL: Well developed, and nourished, appropriately responsive and aware without distress.  Morbidly obese Caucasian female.   EYES: Sclera non-icteric.   EARS, NOSE, MOUTH AND THROAT: Mask worn.   Hearing is intact to voice.  NECK: Trachea is midline, and there is no jugular venous distension.  LYMPH NODES:  Lymph nodes in the neck are not enlarged. RESPIRATORY:  Lungs are clear, and breath sounds are equal bilaterally. Normal respiratory effort without pathologic use of accessory muscles. CARDIOVASCULAR: Heart is regular in rate and rhythm. GI: The abdomen is  soft, nontender, and nondistended.   MUSCULOSKELETAL:  Symmetrical muscle tone appreciated in all four extremities.   The left pretibial area there is a well localized area of swelling anteriorly.  Ultrasound confirms the presence of a fluid collection in the deep subcutaneous tissues. We discussed aspiration under ultrasound imaging.  Proceeded with prepping  her pretibial area with Hibiclens, local anesthesia with 1% lidocaine, and utilizing a 18-gauge needle was able to aspirate 15 mL of a clear serous fluid.  There was remarkable diminishment in the anterior pretibial soft tissues to the patient's to light.  We then applied a Band-Aid and wrapped her proximal foot ankle and distal leg with a Coban supportive compression. SKIN: Skin turgor is normal. No pathologic skin lesions appreciated.  NEUROLOGIC:  Motor and sensation appear grossly normal.  Cranial nerves are grossly without  defect. PSYCH:  Alert and oriented to person, place and time. Affect is appropriate for situation.  Data Reviewed I have personally reviewed what is currently available of the patient's imaging, recent labs and medical records.   Labs:  CBC Latest Ref Rng & Units 07/28/2020 09/07/2017  WBC 4.0 - 10.5 K/uL 7.4 9.8  Hemoglobin 12.0 - 15.0 g/dL 14.7 15.3  Hematocrit 36 - 46 % 43.1 45.8  Platelets 150 - 400 K/uL 357 353   CMP Latest Ref Rng & Units 07/28/2020 09/07/2017  Glucose 70 - 99 mg/dL 105(H) 90  BUN 6 - 20 mg/dL 15 14  Creatinine 0.44 - 1.00 mg/dL 0.90 0.78  Sodium 135 - 145 mmol/L 136 138  Potassium 3.5 - 5.1 mmol/L 3.8 4.4  Chloride 98 - 111 mmol/L 101 105  CO2 22 - 32 mmol/L 26 22  Calcium 8.9 - 10.3 mg/dL 8.7(L) 8.8(L)  Total Protein 6.5 - 8.1 g/dL 6.7 7.1  Total Bilirubin 0.3 - 1.2 mg/dL 0.5 0.5  Alkaline Phos 38 - 126 U/L 93 104  AST 15 - 41 U/L 23 26  ALT 0 - 44 U/L 34 34      Imaging: Ultrasound imaging utilized in the office. Within last 24 hrs: No results found.  Assessment    Posttraumatic seroma left pretibial region. Patient Active Problem List   Diagnosis Date Noted  . Morbid obesity ([place]) 12/23/2017  . Vitamin D deficiency 12/23/2017    Plan    Compression and elevation as much as tolerated over the next 2 weeks.  Anticipating potential for recurrence.  Advised nothing more than aspiration should this recur.  Face-to-face time spent with the patient and accompanying care providers(if present) was 30 minutes, with more than 50% of the time spent counseling, educating, and coordinating care of the patient.      Ronny Bacon M.D., FACS 08/17/2020, 3:20 PM

## 2020-08-25 ENCOUNTER — Ambulatory Visit: Payer: 59 | Admitting: Internal Medicine

## 2020-09-08 ENCOUNTER — Institutional Professional Consult (permissible substitution): Payer: 59 | Admitting: Plastic Surgery

## 2020-09-14 ENCOUNTER — Other Ambulatory Visit (HOSPITAL_COMMUNITY)
Admission: RE | Admit: 2020-09-14 | Discharge: 2020-09-14 | Disposition: A | Discharge status: Home / Self Care | Payer: 59 | Source: Ambulatory Visit | Attending: Obstetrics and Gynecology | Admitting: Obstetrics and Gynecology

## 2020-09-14 ENCOUNTER — Encounter: Payer: Self-pay | Admitting: Obstetrics and Gynecology

## 2020-09-14 ENCOUNTER — Ambulatory Visit (INDEPENDENT_AMBULATORY_CARE_PROVIDER_SITE_OTHER): Payer: 59 | Admitting: Obstetrics and Gynecology

## 2020-09-14 ENCOUNTER — Other Ambulatory Visit: Payer: Self-pay

## 2020-09-14 VITALS — BP 130/70 | Ht 67.0 in | Wt 398.0 lb

## 2020-09-14 DIAGNOSIS — R87610 Atypical squamous cells of undetermined significance on cytologic smear of cervix (ASC-US): Secondary | ICD-10-CM | POA: Insufficient documentation

## 2020-09-14 DIAGNOSIS — R87810 Cervical high risk human papillomavirus (HPV) DNA test positive: Secondary | ICD-10-CM | POA: Diagnosis not present

## 2020-09-14 NOTE — Progress Notes (Signed)
PT HERE FOR COLPOSCOPY TODAY.

## 2020-09-14 NOTE — Progress Notes (Signed)
Obstetrics & Gynecology Office Visit   Chief Complaint:  Chief Complaint  Patient presents with  . Procedure    COLPOSCOPY    History of Present Illness:Betty Fisher is a 24 y.o. woman who presents today for continued surveillance for history of dysplasia. Last pap obtained on 07/27/2020 revealed ASCUS HPV positive pap.  Prior pap 12/13/2017 also ASCUS HPV positive but secondary to age was triaged to 1 year follow up per ASCCP guidelines.  No abnormal bleeding or pain.         Review of Systems: Review of Systems  Constitutional: Negative.   Gastrointestinal: Negative.   Genitourinary: Negative.      Past Medical History:  Patient Active Problem List   Diagnosis Date Noted  . 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 Morbid obesity with body mass index (BMI) of 50.0 to 59.9 in adult ([place]) 12/23/2017  . Vitamin D deficiency 12/23/2017    Past Surgical History:  Patient Active Problem List   Diagnosis Date Noted  . 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 Morbid obesity with body mass index (BMI) of 50.0 to 59.9 in adult ([place]) 12/23/2017  . Vitamin D deficiency 12/23/2017    Gynecologic History: Patient's last menstrual period was 08/17/2020 (exact date).  Obstetric History: No obstetric history on file.  Family History:  Family History  Problem Relation Age of Onset  . Hypertension Mother   . Hypothyroidism Mother   . Hypertension Father     Social History:  Social History   Socioeconomic History  . Marital status: Single    Spouse name: Not on file  . Number of children: Not on file  . Years of education: Not on file  . Highest education level: Not on file  Occupational History  . Not on file  Tobacco Use  . Smoking status: Never Smoker  . Smokeless tobacco: Never Used  Vaping Use  . Vaping Use: Never used  Substance and Sexual Activity  . Alcohol use: Yes    Comment: occationally  .  Drug use: No  . Sexual activity: Not Currently    Birth control/protection: None  Other Topics Concern  . Not on file  Social History Narrative  . Not on file   Social Determinants of Health   Financial Resource Strain:   . Difficulty of Paying Living Expenses: Not on file  Food Insecurity:   . Worried About Charity fundraiser in the Last Year: Not on file  . Ran Out of Food in the Last Year: Not on file  Transportation Needs:   . Lack of Transportation (Medical): Not on file  . Lack of Transportation (Non-Medical): Not on file  Physical Activity:   . Days of Exercise per Week: Not on file  . Minutes of Exercise per Session: Not on file  Stress:   . Feeling of Stress : Not on file  Social Connections:   . Frequency of Communication with Friends and Family: Not on file  . Frequency of Social Gatherings with Friends and Family: Not on file  . Attends Religious Services: Not on file  . Active Member of Clubs or Organizations: Not on file  . Attends Archivist Meetings: Not on file  . Marital Status: Not on file  Intimate Partner Violence:   . Fear of Current or Ex-Partner: Not on file  . Emotionally Abused: Not on file  .  Physically Abused: Not on file  . Sexually Abused: Not on file    Allergies:  No Known Allergies  Medications: Prior to Admission medications   Medication Sig Start Date End Date Taking? Authorizing Provider  Calcium 600-400 MG-UNIT CHEW Chew by mouth.   Yes [provider]  ibuprofen (ADVIL) 800 MG tablet Take 800 mg by mouth every 8 (eight) hours as needed.   Yes [provider]  levothyroxine (SYNTHROID) 50 MCG tablet Take 1 tablet (50 mcg total) by mouth daily before breakfast. 08/11/20  Yes Lavera Guise, MD    Physical Exam Vitals:  Vitals:   09/14/20 1432  BP: 130/70   Patient's last menstrual period was 08/17/2020 (exact date).  General: NAD, well nourished, appears stated age 1: normocephalic,  anicteric Pulmonary: No increased work of breathing Genitourinary:  External: Normal external female genitalia.  Normal urethral meatus, normal  Bartholin's and Skene's glands.    Vagina: Normal vaginal mucosa, no evidence of prolapse.    Cervix: Grossly normal in appearance, no bleeding Extremities: no edema, erythema, or tenderness Neurologic: Grossly intact Psychiatric: mood appropriate, affect full  Female chaperone present for pelvic and breast  portions of the physical exam  GYNECOLOGY CLINIC COLPOSCOPY PROCEDURE NOTE  24 y.o. No obstetric history on file. here for colposcopy for ASCUS with POSITIVE high risk HPV  pap smear on 07/27/2020. Discussed underlying role for HPV infection in the development of cervical dysplasia, its natural history and progression/regression, need for surveillance.  Is the patient  pregnant: No LMP: Patient's last menstrual period was 08/17/2020 (exact date). Smoking status:  reports that she has never smoked. She has never used smokeless tobacco. Contraception: none High risk partner:No History of STD:  Yes Future fertility desired:  No  Patient given informed consent, signed copy in the chart, time out was performed.  The patient was position in dorsal lithotomy position. Speculum was placed the cervix was visualized.   After application of acetic acid colposcopic inspection of the cervix was undertaken.   Colposcopy adequate, full visualization of transformation zone: Yes no visible lesions and acetowhite lesion(s) noted at 11 o'clock; corresponding biopsies obtained Ectocervix 11 O'Clock and ECC ECC specimen obtained:  Yes  All specimens were labeled and sent to pathology.   Patient was given post procedure instructions.  Will follow up pathology and manage accordingly.  Routine preventative health maintenance measures emphasized.   Assessment: 24 y.o. follow up for ASCUS HPV positive pap  Plan: Problem List Items Addressed This Visit    None     Visit Diagnoses    ASCUS with positive high risk HPV cervical    -  Primary   Relevant Orders   Surgical pathology (Completed)      - Colposcopy performed today  - I had a lengthly discussion with [name]  regarding the cause of dysplasia of the lower genital tract (including immunosuppression in the setting of HPV exposure and tobacco exposure). I explained the potential for progression to invasive malignancy, the recurrent nature of these lesions (and the need for close continued followup). Results of today's pap will dictate need for further evaluation and follow up per ASCCP guidelines.  We discussed that 80% of the population will have exposure to human papilloma virus (HPV) during their lifetime.  HPV is a large group of viruses, and are also the causative virus for common warts and genital warts.  The pap smear tests for 13 high risk HPV strains that have some association  with cervical cancer, but do not cause visual lesion such as warts.  HPV type 16 and 18 have the highest association with cervical cancer.  The vast majority of HPV infections will be uncomplicated at clear spontaneously in 12-18 months in non-immunocompromised patients.  Patient with compromised immune systems, those taking immunosuppressive drugs, or smoker have shown to have a lower clearance rate and higher persistence of HPV infection.    Currently there are no FDA approved treatments to promote HPV clearance.  Gardasil vaccination is available to prevent HPV infection, but this is only beneficial pre-exposure.  Abstaining from intercourse will not increase clearance  Lastly we stressed that if properly followed HPV should not lead to cervical cancer.   The goal of screening is to identify patient who develop precancerous lesions of the cervix and treat these prior to progression to frank cervical cancer.  The incidence of cervical cancer is 7 cases per 100,000 women in the [place] a year.  This relatively low rate  is in part due to universal screening as well as vaccination efforts.    - She is comfortable with the plan and had her questions answered.  - Return in about 1 year (around 09/14/2021) for annual.   Malachy Mood, MD, [place] 09/17/2020, 8:53 AM

## 2020-09-16 LAB — SURGICAL PATHOLOGY

## 2020-09-28 ENCOUNTER — Ambulatory Visit: Payer: 59 | Admitting: Hospice and Palliative Medicine

## 2020-09-28 ENCOUNTER — Encounter: Payer: Self-pay | Admitting: Hospice and Palliative Medicine

## 2020-11-05 ENCOUNTER — Other Ambulatory Visit: Payer: Self-pay | Admitting: Hospice and Palliative Medicine

## 2020-12-21 ENCOUNTER — Ambulatory Visit: Payer: 59 | Admitting: Hospice and Palliative Medicine

## 2020-12-22 ENCOUNTER — Ambulatory Visit: Payer: 59 | Admitting: Hospice and Palliative Medicine

## 2021-01-11 ENCOUNTER — Ambulatory Visit: Payer: 59 | Admitting: Internal Medicine

## 2021-01-11 ENCOUNTER — Encounter: Payer: Self-pay | Admitting: Hospice and Palliative Medicine

## 2021-01-11 ENCOUNTER — Other Ambulatory Visit
Admission: RE | Admit: 2021-01-11 | Discharge: 2021-01-11 | Disposition: A | Discharge status: Home / Self Care | Payer: 59 | Source: Ambulatory Visit | Attending: Internal Medicine | Admitting: Internal Medicine

## 2021-01-11 ENCOUNTER — Other Ambulatory Visit: Payer: Self-pay

## 2021-01-11 VITALS — BP 140/84 | HR 97 | Temp 98.0°F | Resp 16 | Ht 67.0 in | Wt 368.2 lb

## 2021-01-11 DIAGNOSIS — M79605 Pain in left leg: Secondary | ICD-10-CM

## 2021-01-11 DIAGNOSIS — E039 Hypothyroidism, unspecified: Secondary | ICD-10-CM

## 2021-01-11 DIAGNOSIS — Z6841 Body Mass Index (BMI) 40.0 and over, adult: Secondary | ICD-10-CM

## 2021-01-11 LAB — TSH: TSH: 3.974 u[IU]/mL (ref 0.350–4.500)

## 2021-01-11 LAB — T4, FREE: Free T4: 0.93 ng/dL (ref 0.61–1.12)

## 2021-01-11 MED ORDER — IBUPROFEN 800 MG PO TABS: 800.0000 mg | ORAL_TABLET | Freq: Three times a day (TID) | ORAL | 3 refills | Status: AC | PRN

## 2021-01-11 NOTE — Progress Notes (Signed)
Pt was referred to gyn for repeat pap

## 2021-01-11 NOTE — Progress Notes (Signed)
[facility] [place], [place] [address]  Internal MEDICINE  Office Visit Note  Patient Name: Betty Fisher  [id number]  [id number]  Date of Service: 01/18/2021  Chief Complaint  Patient presents with  . Medication Refill  . Leg Swelling    Left lower leg swelling.  Pt fell a year ago and is still having swelling  . Hypothyroidism    Check thyroid levels     HPI  Pt is here for routine follow up  1. C.o leg pain, had injury and since then she has been having problem with it, will like to see vascular for it 2. Pt was started on Synthroid, did not go for blood work  3. Continues to have wt problems, will like to start wt and wellness management  Current Medication: Outpatient Encounter Medications as of 01/11/2021  Medication Sig  . Calcium 600-400 MG-UNIT CHEW Chew by mouth.  . levothyroxine (SYNTHROID) 50 MCG tablet Take 1 tablet (50 mcg total) by mouth daily before breakfast.  . [DISCONTINUED] ibuprofen (ADVIL) 800 MG tablet Take 800 mg by mouth every 8 (eight) hours as needed.  Marland Kitchen ibuprofen (ADVIL) 800 MG tablet Take 1 tablet (800 mg total) by mouth every 8 (eight) hours as needed.   No facility-administered encounter medications on file as of 01/11/2021.    Surgical History: Past Surgical History:  Procedure Laterality Date  . WISDOM TOOTH EXTRACTION      Medical History: Past Medical History:  Diagnosis Date  . Vitamin D deficiency     Family History: Family History  Problem Relation Age of Onset  . Hypertension Mother   . Hypothyroidism Mother   . Hypertension Father     Social History   Socioeconomic History  . Marital status: Single    Spouse name: Not on file  . Number of children: Not on file  . Years of education: Not on file  . Highest education level: Not on file  Occupational History  . Not on file  Tobacco Use  . Smoking status: Never Smoker  . Smokeless tobacco: Never Used  Vaping Use  . Vaping Use: Never used   Substance and Sexual Activity  . Alcohol use: Yes    Comment: occationally  . Drug use: No  . Sexual activity: Not Currently    Birth control/protection: None  Other Topics Concern  . Not on file  Social History Narrative  . Not on file   Social Determinants of Health   Financial Resource Strain: Not on file  Food Insecurity: Not on file  Transportation Needs: Not on file  Physical Activity: Not on file  Stress: Not on file  Social Connections: Not on file  Intimate Partner Violence: Not on file      Review of Systems  Constitutional: Negative for chills, diaphoresis and fatigue.  HENT: Negative for ear pain, postnasal drip and sinus pressure.   Eyes: Negative for photophobia, discharge, redness, itching and visual disturbance.  Respiratory: Negative for cough, shortness of breath and wheezing.   Cardiovascular: Negative for chest pain, palpitations and leg swelling.  Gastrointestinal: Negative for abdominal pain, constipation, diarrhea, nausea and vomiting.  Genitourinary: Negative for dysuria and flank pain.  Musculoskeletal: Negative for arthralgias, back pain, gait problem and neck pain.  Skin: Negative for color change.  Allergic/Immunologic: Negative for environmental allergies and food allergies.  Neurological: Negative for dizziness and headaches.  Hematological: Does not bruise/bleed easily.  Psychiatric/Behavioral: Negative for agitation, behavioral problems (depression) and hallucinations.  Vital Signs: BP 140/84   Pulse 97   Temp 98 F (36.7 C)   Resp 16   Ht 5\' 7"  (1.702 m)   Wt (!) 368 lb 3.2 oz (167 kg)   SpO2 98%   BMI 57.67 kg/m    Physical Exam Constitutional:      Appearance: She is obese.  HENT:     Head: Normocephalic and atraumatic.  Eyes:     Extraocular Movements: Extraocular movements intact.     Pupils: Pupils are equal, round, and reactive to light.  Cardiovascular:     Rate and Rhythm: Normal rate and regular rhythm.      Pulses: Normal pulses.     Heart sounds: Normal heart sounds.  Neurological:     General: No focal deficit present.     Mental Status: She is alert.  Psychiatric:        Mood and Affect: Mood normal.        Assessment/Plan: 1. Hypothyroidism, unspecified type Continue synthroid, adjust dosing after recent level is checked  - TSH + free T4  2. Leg pain, anterior, left Ongoing pain, pt insists to see vascular  - Ambulatory referral to Vascular Surgery  3. BMI 50.0-59.9, adult (HCC) Obesity Counseling: Risk Assessment: An assessment of behavioral risk factors was made today and includes lack of exercise sedentary lifestyle, lack of portion control and poor dietary habits.  Risk Modification Advice: She was counseled on portion control guidelines. Restricting daily caloric intake to 1800. The detrimental long term effects of obesity on her health and ongoing poor compliance was also discussed with the patient. General Counseling: jonasia coiner understanding of the findings of todays visit and agrees with plan of treatment. I have discussed any further diagnostic evaluation that may be needed or ordered today. We also reviewed her medications today. she has been encouraged to call the office with any questions or concerns that should arise related to todays visit.    Orders Placed This Encounter  Procedures  . TSH + free T4  . Ambulatory referral to Vascular Surgery    Meds ordered this encounter  Medications  . ibuprofen (ADVIL) 800 MG tablet    Sig: Take 1 tablet (800 mg total) by mouth every 8 (eight) hours as needed.    Dispense:  30 tablet    Refill:  3    Total time spent:30 Minutes Time spent includes review of chart, medications, test results, and follow up plan with the patient.   [place] Controlled Substance Database was reviewed by me.   Dr Lavera Guise Internal medicine

## 2021-01-24 ENCOUNTER — Ambulatory Visit: Payer: 59 | Admitting: Internal Medicine

## 2021-02-22 ENCOUNTER — Ambulatory Visit: Payer: 59 | Admitting: Internal Medicine

## 2021-02-28 ENCOUNTER — Encounter (INDEPENDENT_AMBULATORY_CARE_PROVIDER_SITE_OTHER): Payer: 59 | Admitting: Vascular Surgery

## 2021-03-09 ENCOUNTER — Other Ambulatory Visit
Admission: RE | Admit: 2021-03-09 | Discharge: 2021-03-09 | Disposition: A | Discharge status: Home / Self Care | Payer: 59 | Attending: Internal Medicine | Admitting: Internal Medicine

## 2021-03-09 ENCOUNTER — Ambulatory Visit (INDEPENDENT_AMBULATORY_CARE_PROVIDER_SITE_OTHER): Payer: 59 | Admitting: Internal Medicine

## 2021-03-09 ENCOUNTER — Encounter: Payer: Self-pay | Admitting: Nurse Practitioner

## 2021-03-09 ENCOUNTER — Other Ambulatory Visit: Payer: Self-pay

## 2021-03-09 VITALS — BP 132/82 | HR 88 | Temp 98.7°F | Resp 16 | Ht 67.0 in | Wt 367.4 lb

## 2021-03-09 DIAGNOSIS — Z6841 Body Mass Index (BMI) 40.0 and over, adult: Secondary | ICD-10-CM | POA: Diagnosis not present

## 2021-03-09 DIAGNOSIS — E039 Hypothyroidism, unspecified: Secondary | ICD-10-CM

## 2021-03-09 LAB — TSH: TSH: 4.814 u[IU]/mL — ABNORMAL HIGH (ref 0.350–4.500)

## 2021-03-09 LAB — T4, FREE: Free T4: 1.06 ng/dL (ref 0.61–1.12)

## 2021-03-09 NOTE — Progress Notes (Signed)
[facility] [place], [place] [address]  Internal MEDICINE  Office Visit Note  Patient Name: Betty Fisher  [id number]  [id number]  Date of Service: 03/16/2021  Chief Complaint  Patient presents with  . Hypothyroidism    Weight management     HPI Patient is here for routine follow-up.  She has hypothyroidism and Synthroid is being titrated right now she is taking Synthroid 50 mcg once a day.  She has not had any recent labs in the last 6 to 8 weeks.  Patient has weight issues profile I will schedule worse and has gotten worse during COVID.  She needs some guidelines about calorie restriction and active lifestyle.  Home cooking is not well-balanced diet. Does consume fast food    Current Medication: Outpatient Encounter Medications as of 03/09/2021  Medication Sig  . Calcium 600-400 MG-UNIT CHEW Chew by mouth.  Marland Kitchen ibuprofen (ADVIL) 800 MG tablet Take 1 tablet (800 mg total) by mouth every 8 (eight) hours as needed.  Marland Kitchen levothyroxine (SYNTHROID) 50 MCG tablet Take 1 tablet (50 mcg total) by mouth daily before breakfast.   No facility-administered encounter medications on file as of 03/09/2021.    Surgical History: Past Surgical History:  Procedure Laterality Date  . WISDOM TOOTH EXTRACTION      Medical History: Past Medical History:  Diagnosis Date  . Vitamin D deficiency     Family History: Family History  Problem Relation Age of Onset  . Hypertension Mother   . Hypothyroidism Mother   . Hypertension Father     Social History   Socioeconomic History  . Marital status: Single    Spouse name: Not on file  . Number of children: Not on file  . Years of education: Not on file  . Highest education level: Not on file  Occupational History  . Not on file  Tobacco Use  . Smoking status: Never Smoker  . Smokeless tobacco: Never Used  Vaping Use  . Vaping Use: Never used  Substance and Sexual Activity  . Alcohol use: Yes    Comment:  occationally  . Drug use: No  . Sexual activity: Not Currently    Birth control/protection: None  Other Topics Concern  . Not on file  Social History Narrative  . Not on file   Social Determinants of Health   Financial Resource Strain: Not on file  Food Insecurity: Not on file  Transportation Needs: Not on file  Physical Activity: Not on file  Stress: Not on file  Social Connections: Not on file  Intimate Partner Violence: Not on file      Review of Systems  Constitutional: Negative for fatigue and fever.  HENT: Negative for congestion, mouth sores and postnasal drip.   Respiratory: Negative for cough.   Cardiovascular: Negative for chest pain.  Genitourinary: Negative for flank pain.  Psychiatric/Behavioral: Negative.     Vital Signs: BP 132/82   Pulse 88   Temp 98.7 F (37.1 C)   Resp 16   Ht 5\' 7"  (1.702 m)   Wt (!) 367 lb 6.4 oz (166.7 kg)   SpO2 96%   BMI 57.54 kg/m    Physical Exam No exam is done     Assessment/Plan: 1. Hypothyroidism, unspecified type Continue Synthroid 50 mcg once a day if TSH elevated I will increase her Synthroid to 75 once a day - TSH + free T4  2. BMI 50.0-59.9, adult [facility]) Patient has BMI of 50 did lose about  10 pounds since the start of Synthroid and she is very proud of that patient is instructed to continue to work on her diet and restriction of calories to 1500 Samples of Rybelsus 3 mg once a day given to the patient to see if it does work with her appetite - Metabolic Test Obesity Counseling: Risk Assessment: An assessment of behavioral risk factors was made today and includes lack of exercise sedentary lifestyle, lack of portion control and poor dietary habits.  Risk Modification Advice: She was counseled on portion control guidelines. Restricting daily caloric intake to 1500 The detrimental long term effects of obesity on her health and ongoing poor compliance was also discussed with the patient.   General  Counseling: theone bowell understanding of the findings of todays visit and agrees with plan of treatment. I have discussed any further diagnostic evaluation that may be needed or ordered today. We also reviewed her medications today. she has been encouraged to call the office with any questions or concerns that should arise related to todays visit.    Orders Placed This Encounter  Procedures  . Metabolic Test  . TSH + free T4    No orders of the defined types were placed in this encounter.   Total time spent:30 Minutes Time spent includes review of chart, medications, test results, and follow up plan with the patient.   [place] Controlled Substance Database was reviewed by me.   Dr Lavera Guise Internal medicine

## 2021-03-10 ENCOUNTER — Encounter (INDEPENDENT_AMBULATORY_CARE_PROVIDER_SITE_OTHER): Payer: 59 | Admitting: Vascular Surgery

## 2021-03-17 ENCOUNTER — Other Ambulatory Visit: Payer: Self-pay

## 2021-03-17 ENCOUNTER — Telehealth: Payer: Self-pay

## 2021-03-17 MED ORDER — LEVOTHYROXINE SODIUM 75 MCG PO TABS: 75.0000 ug | ORAL_TABLET | Freq: Every day | ORAL | 3 refills | Status: DC

## 2021-03-17 NOTE — Telephone Encounter (Signed)
LMOM telling pt that her thyroid medication needs to be increased and we sent a new prescription to the pharmacy.

## 2021-03-17 NOTE — Telephone Encounter (Signed)
LMOM for pt that we increased her thyroid medication and new prescription was sent to pharmacy also spoke to pt's mother and informed her of the changes and new rx.

## 2021-03-21 ENCOUNTER — Encounter: Payer: Self-pay | Admitting: Internal Medicine

## 2021-03-22 ENCOUNTER — Encounter (INDEPENDENT_AMBULATORY_CARE_PROVIDER_SITE_OTHER): Payer: Self-pay | Admitting: Vascular Surgery

## 2021-04-04 ENCOUNTER — Encounter (INDEPENDENT_AMBULATORY_CARE_PROVIDER_SITE_OTHER): Payer: Self-pay | Admitting: Vascular Surgery

## 2021-04-07 ENCOUNTER — Other Ambulatory Visit: Payer: Self-pay

## 2021-04-11 ENCOUNTER — Encounter (INDEPENDENT_AMBULATORY_CARE_PROVIDER_SITE_OTHER): Payer: Self-pay | Admitting: Vascular Surgery

## 2021-04-14 ENCOUNTER — Ambulatory Visit: Payer: 59 | Admitting: Physician Assistant

## 2021-04-19 ENCOUNTER — Ambulatory Visit: Payer: Self-pay | Admitting: Nurse Practitioner

## 2021-04-21 ENCOUNTER — Ambulatory Visit (INDEPENDENT_AMBULATORY_CARE_PROVIDER_SITE_OTHER): Payer: 59 | Admitting: Vascular Surgery

## 2021-04-21 ENCOUNTER — Other Ambulatory Visit: Payer: Self-pay

## 2021-04-21 VITALS — BP 128/86 | HR 76 | Ht 67.0 in | Wt 372.0 lb

## 2021-04-21 DIAGNOSIS — M79662 Pain in left lower leg: Secondary | ICD-10-CM | POA: Diagnosis not present

## 2021-04-21 DIAGNOSIS — M7989 Other specified soft tissue disorders: Secondary | ICD-10-CM

## 2021-04-21 DIAGNOSIS — T792XXA Traumatic secondary and recurrent hemorrhage and seroma, initial encounter: Secondary | ICD-10-CM | POA: Diagnosis not present

## 2021-04-21 NOTE — Progress Notes (Signed)
MRN : [id number]  Betty Fisher is a 25 y.o. (1996-07-02) female who presents with chief complaint of  Chief Complaint  Patient presents with   New Patient (Initial Visit)    NP khan LLE pain  .  History of Present Illness:   Patient is seen for evaluation of leg pain and leg swelling. The patient first noticed the swelling remotely after a trauma to her leg. The swelling is associated with pain and discoloration. The pain and swelling worsens with prolonged dependency and improves with elevation. The pain is unrelated to activity.  The patient notes that in the morning the legs are significantly improved but they steadily worsened throughout the course of the day. The patient also notes a steady worsening of the discoloration in the ankle and shin area.   The patient denies claudication symptoms.  The patient denies symptoms consistent with rest pain.  The patient denies and extensive history of DJD and LS spine disease.  The patient has no had any past angiography, interventions or vascular surgery.  Elevation makes the leg symptoms better, dependency makes them much worse. There is no history of ulcerations. The patient denies any recent changes in medications.  The patient has tried wearing graduated compression but didn't seem to help much.  The patient denies a history of DVT or PE. There is no prior history of phlebitis. There is no history of primary lymphedema.  No history of malignancies. No history of trauma or groin or pelvic surgery. There is no history of radiation treatment to the groin or pelvis  The patient denies amaurosis fugax or recent TIA symptoms. There are no recent neurological changes noted. The patient denies recent episodes of angina or shortness of breath   Current Meds  Medication Sig   ibuprofen (ADVIL) 800 MG tablet Take 1 tablet (800 mg total) by mouth every 8 (eight) hours as needed.   levothyroxine (SYNTHROID) 75 MCG tablet Take 1 tablet (75 mcg  total) by mouth daily.    Past Medical History:  Diagnosis Date   Vitamin D deficiency     Past Surgical History:  Procedure Laterality Date   WISDOM TOOTH EXTRACTION      Social History Social History   Tobacco Use   Smoking status: Never   Smokeless tobacco: Never  Vaping Use   Vaping Use: Never used  Substance Use Topics   Alcohol use: Yes    Comment: occationally   Drug use: No    Family History Family History  Problem Relation Age of Onset   Hypertension Mother    Hypothyroidism Mother    Hypertension Father   No family history of bleeding/clotting disorders, porphyria or autoimmune disease   No Known Allergies   REVIEW OF SYSTEMS (Negative unless checked)  Constitutional: [] Weight loss  [] Fever  [] Chills Cardiac: [] Chest pain   [] Chest pressure   [] Palpitations   [] Shortness of breath when laying flat   [] Shortness of breath with exertion. Vascular:  [] Pain in legs with walking   [x] Pain in legs at rest  [] History of DVT   [] Phlebitis   [x] Swelling in legs   [] Varicose veins   [] Non-healing ulcers Pulmonary:   [] Uses home oxygen   [] Productive cough   [] Hemoptysis   [] Wheeze  [] COPD   [] Asthma Neurologic:  [] Dizziness   [] Seizures   [] History of stroke   [] History of TIA  [] Aphasia   [] Vissual changes   [] Weakness or numbness in arm   [] Weakness or numbness in  leg Musculoskeletal:   [] Joint swelling   [] Joint pain   [] Low back pain Hematologic:  [] Easy bruising  [] Easy bleeding   [] Hypercoagulable state   [] Anemic Gastrointestinal:  [] Diarrhea   [] Vomiting  [] Gastroesophageal reflux/heartburn   [] Difficulty swallowing. Genitourinary:  [] Chronic kidney disease   [] Difficult urination  [] Frequent urination   [] Blood in urine Skin:  [] Rashes   [] Ulcers  Psychological:  [] History of anxiety   []  History of major depression.  Physical Examination  Vitals:   04/21/21 1538  BP: 128/86  Pulse: 76  Weight: (!) 372 lb (168.7 kg)  Height: 5\' 7"  (1.702 m)   Body  mass index is 58.26 kg/m. Gen: WD/WN, NAD Head: [place]/AT, No temporalis wasting.  Ear/Nose/Throat: Hearing grossly intact, nares w/o erythema or drainage, poor dentition Eyes: PER, EOMI, sclera nonicteric.  Neck: Supple, no masses.  No bruit or JVD.  Pulmonary:  Good air movement, clear to auscultation bilaterally, no use of accessory muscles.  Cardiac: RRR, normal S1, S2, no Murmurs. Vascular:  swelling and mass left shin Vessel Right Left  Radial Palpable Palpable  PT Palpable Palpable  DP Palpable Palpable  Gastrointestinal: soft, non-distended. No guarding/no peritoneal signs.  Musculoskeletal: M/S 5/5 throughout.  No deformity or atrophy.  Neurologic: CN 2-12 intact. Pain and light touch intact in extremities.  Symmetrical.  Speech is fluent. Motor exam as listed above. Psychiatric: Judgment intact, Mood & affect appropriate for pt's clinical situation. Dermatologic: No rashes or ulcers noted.  No changes consistent with cellulitis.  CBC Lab Results  Component Value Date   WBC 7.4 07/28/2020   HGB 14.7 07/28/2020   HCT 43.1 07/28/2020   MCV 86.7 07/28/2020   PLT 357 07/28/2020    BMET    Component Value Date/Time   NA 136 07/28/2020 1006   K 3.8 07/28/2020 1006   CL 101 07/28/2020 1006   CO2 26 07/28/2020 1006   GLUCOSE 105 (H) 07/28/2020 1006   BUN 15 07/28/2020 1006   CREATININE 0.90 07/28/2020 1006   CALCIUM 8.7 (L) 07/28/2020 1006   GFRNONAA >60 07/28/2020 1006   GFRAA >60 07/28/2020 1006   CrCl cannot be calculated (Patient's most recent lab result is older than the maximum 21 days allowed.).  COAG No results found for: INR, PROTIME  Radiology No results found.   Assessment/Plan 1. Pain and swelling of left lower leg I have had a long discussion with the patient regarding swelling and why it  causes symptoms.  Patient will begin wearing graduated compression stockings class 1 (20-30 mmHg) on a daily basis a prescription was given. The patient will   beginning wearing the stockings first thing in the morning and removing them in the evening. The patient is instructed specifically not to sleep in the stockings.   In addition, behavioral modification will be initiated.  This will include frequent elevation, use of over the counter pain medications and exercise such as walking.  I have reviewed systemic causes for chronic edema such as liver, kidney and cardiac etiologies.  The patient denies problems with these organ systems.    Consideration for a lymph pump will also be made based upon the effectiveness of conservative therapy.  This would help to improve the edema control and prevent sequela such as ulcers and infections   Patient should undergo duplex ultrasound of the venous system to ensure that DVT  is not present this will also allow evaluation of the mass in the shin.  The patient will follow-up with me  after the ultrasound.    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

## 2021-04-25 ENCOUNTER — Encounter (INDEPENDENT_AMBULATORY_CARE_PROVIDER_SITE_OTHER): Payer: Self-pay | Admitting: Vascular Surgery

## 2021-04-25 DIAGNOSIS — M7989 Other specified soft tissue disorders: Secondary | ICD-10-CM | POA: Insufficient documentation

## 2021-05-12 ENCOUNTER — Ambulatory Visit (INDEPENDENT_AMBULATORY_CARE_PROVIDER_SITE_OTHER): Payer: 59

## 2021-05-12 ENCOUNTER — Encounter: Payer: Self-pay | Admitting: Internal Medicine

## 2021-05-12 ENCOUNTER — Ambulatory Visit (INDEPENDENT_AMBULATORY_CARE_PROVIDER_SITE_OTHER): Payer: 59 | Admitting: Nurse Practitioner

## 2021-05-12 ENCOUNTER — Other Ambulatory Visit: Payer: Self-pay

## 2021-05-12 VITALS — BP 177/88 | HR 71 | Ht 67.0 in | Wt 371.0 lb

## 2021-05-12 DIAGNOSIS — R600 Localized edema: Secondary | ICD-10-CM

## 2021-05-12 DIAGNOSIS — M7989 Other specified soft tissue disorders: Secondary | ICD-10-CM

## 2021-05-12 DIAGNOSIS — M79662 Pain in left lower leg: Secondary | ICD-10-CM

## 2021-05-12 DIAGNOSIS — T792XXA Traumatic secondary and recurrent hemorrhage and seroma, initial encounter: Secondary | ICD-10-CM | POA: Diagnosis not present

## 2021-05-16 ENCOUNTER — Ambulatory Visit: Payer: Self-pay | Admitting: Internal Medicine

## 2021-05-17 ENCOUNTER — Other Ambulatory Visit: Payer: Self-pay

## 2021-05-17 ENCOUNTER — Encounter: Payer: Self-pay | Admitting: Physician Assistant

## 2021-05-17 ENCOUNTER — Ambulatory Visit (INDEPENDENT_AMBULATORY_CARE_PROVIDER_SITE_OTHER): Payer: 59 | Admitting: Physician Assistant

## 2021-05-17 DIAGNOSIS — Z23 Encounter for immunization: Secondary | ICD-10-CM

## 2021-05-17 DIAGNOSIS — Z0289 Encounter for other administrative examinations: Secondary | ICD-10-CM | POA: Diagnosis not present

## 2021-05-17 DIAGNOSIS — Z111 Encounter for screening for respiratory tuberculosis: Secondary | ICD-10-CM | POA: Diagnosis not present

## 2021-05-17 DIAGNOSIS — T792XXA Traumatic secondary and recurrent hemorrhage and seroma, initial encounter: Secondary | ICD-10-CM

## 2021-05-17 MED ORDER — TETANUS-DIPHTH-ACELL PERTUSSIS 5-2.5-18.5 LF-MCG/0.5 IM SUSP: 0.5000 mL | Freq: Once | INTRAMUSCULAR | 0 refills | Status: AC

## 2021-05-17 NOTE — Progress Notes (Signed)
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 --PPD placed in office and patient will return in 48-72hours for reading --She is due to Tdap, but up to date on all other immunizations --She has had a physical in the last year as well which meets requirements of form --She has no complaints today and is doing well --Wanted to update office that Seroma on left shin was seen on [place], followed by vascular and has plans for surgery since it is not resolving. No date set and she is able to carry out daily functions.  Current Medication: Outpatient Encounter Medications as of 05/17/2021  Medication Sig   ibuprofen (ADVIL) 800 MG tablet Take 1 tablet (800 mg total) by mouth every 8 (eight) hours as needed.   levothyroxine (SYNTHROID) 75 MCG tablet Take 1 tablet (75 mcg total) by mouth daily.   [DISCONTINUED] Tdap (BOOSTRIX) 5-2.5-18.5 LF-MCG/0.5 injection Inject 0.5 mLs into the muscle once.   Calcium 600-400 MG-UNIT CHEW Chew by mouth. (Patient not taking: Reported on 05/17/2021)   No facility-administered encounter medications on file as of 05/17/2021.    Surgical History: Past Surgical History:  Procedure Laterality Date   WISDOM TOOTH EXTRACTION      Medical History: Past Medical History:  Diagnosis Date   Vitamin D deficiency     Family History: Family History  Problem Relation Age of Onset   Hypertension Mother    Hypothyroidism Mother    Hypertension Father     Social History   Socioeconomic History   Marital status: Single    Spouse name: Not on file   Number of children: Not on file   Years of  education: Not on file   Highest education level: Not on file  Occupational History   Not on file  Tobacco Use   Smoking status: Never   Smokeless tobacco: Never  Vaping Use   Vaping Use: Never used  Substance and Sexual Activity   Alcohol use: Yes    Comment: occationally   Drug use: No   Sexual activity: Not Currently    Birth control/protection: None  Other Topics Concern   Not on file  Social History Narrative   Not on file   Social Determinants of Health   Financial Resource Strain: Not on file  Food Insecurity: Not on file  Transportation Needs: Not on file  Physical Activity: Not on file  Stress: Not on file  Social Connections: Not on file  Intimate Partner Violence: Not on file      Review of Systems  Constitutional:  Negative for chills, fatigue and unexpected weight change.  HENT:  Negative for congestion, postnasal drip, rhinorrhea, sneezing and sore throat.   Eyes:  Negative for redness.  Respiratory:  Negative for cough, chest tightness and shortness of breath.   Cardiovascular:  Negative for chest pain and palpitations.  Gastrointestinal:  Negative for abdominal pain, constipation, diarrhea, nausea and vomiting.  Genitourinary:  Negative for dysuria and frequency.  Musculoskeletal:  Negative for arthralgias, back pain, joint swelling and neck pain.  Skin:  Negative for rash.  Neurological:  Negative.  Negative for tremors and numbness.  Hematological:  Negative for adenopathy. Does not bruise/bleed easily.  Psychiatric/Behavioral:  Negative for behavioral problems (Depression), sleep disturbance and suicidal ideas. The patient is not nervous/anxious.    Vital Signs: BP 139/86   Pulse 76   Temp 98.4 F (36.9 C)   Resp 16   Ht 5\' 7"  (1.702 m)   Wt (!) 378 lb 6.4 oz (171.6 kg)   SpO2 98%   BMI 59.27 kg/m    Physical Exam Vitals and nursing note reviewed.  Constitutional:      General: She is not in acute distress.    Appearance: She is  well-developed. She is obese. She is not diaphoretic.  HENT:     Head: Normocephalic and atraumatic.     Mouth/Throat:     Pharynx: No oropharyngeal exudate.  Eyes:     Pupils: Pupils are equal, round, and reactive to light.  Neck:     Thyroid: No thyromegaly.     Vascular: No JVD.     Trachea: No tracheal deviation.  Cardiovascular:     Rate and Rhythm: Normal rate and regular rhythm.     Heart sounds: Normal heart sounds. No murmur heard.   No friction rub. No gallop.  Pulmonary:     Effort: Pulmonary effort is normal. No respiratory distress.     Breath sounds: No wheezing or rales.  Chest:     Chest wall: No tenderness.  Abdominal:     General: Bowel sounds are normal.     Palpations: Abdomen is soft.  Musculoskeletal:        General: Normal range of motion.     Cervical back: Normal range of motion and neck supple.  Lymphadenopathy:     Cervical: No cervical adenopathy.  Skin:    General: Skin is warm and dry.  Neurological:     Mental Status: She is alert and oriented to person, place, and time.     Cranial Nerves: No cranial nerve deficit.  Psychiatric:        Behavior: Behavior normal.        Thought Content: Thought content normal.        Judgment: Judgment normal.       Assessment/Plan: 1. Encounter for completion of form with patient Form for internship completed and will be given to patient upon negative PPD reading  2. Screening for tuberculosis PPD placed and will come back for reading in 48-72 hours - PPD  3. Need for Tdap vaccination Will update tdap at pharmacy and update office  4. 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 I have discussed any further diagnostic evaluation that may be needed or ordered today. We also reviewed her medications today. she has been encouraged to call the office with any questions or  concerns that should arise related to todays visit.    Orders Placed This Encounter  Procedures   PPD    No orders of the defined types were placed in this encounter.   This patient was seen by Drema Dallas, PA-C in collaboration with Dr. [name] as a part of collaborative care agreement.   Total time spent:30 Minutes Time spent includes review of chart, medications, test results, and follow up plan with the patient.      Dr Lavera Guise Internal medicine

## 2021-05-19 ENCOUNTER — Other Ambulatory Visit: Payer: Self-pay

## 2021-05-19 ENCOUNTER — Ambulatory Visit: Payer: 59

## 2021-05-19 LAB — TB SKIN TEST
Induration: 0 mm
TB Skin Test: NEGATIVE

## 2021-05-19 MED ORDER — TETANUS-DIPHTH-ACELL PERTUSSIS 5-2.5-18.5 LF-MCG/0.5 IM SUSY: 0.5000 mL | PREFILLED_SYRINGE | Freq: Once | INTRAMUSCULAR | 0 refills | Status: AC

## 2021-05-20 ENCOUNTER — Encounter (INDEPENDENT_AMBULATORY_CARE_PROVIDER_SITE_OTHER): Payer: Self-pay

## 2021-05-22 ENCOUNTER — Encounter (INDEPENDENT_AMBULATORY_CARE_PROVIDER_SITE_OTHER): Payer: Self-pay | Admitting: Nurse Practitioner

## 2021-05-22 NOTE — H&P (View-Only) (Signed)
Subjective:    Patient ID: Betty Fisher, female    DOB: [birth year], 25 y.o.   MRN: [id number] Chief Complaint  Patient presents with   Follow-up    Pt conv.LLE ven  reflux     Betty Fisher is a 25 year old female that presents today for evaluation of swelling in her left lower extremity.  The patient notes that she has had this swollen area for well over a year.  It occurred post traumatically.  She was initially told that it was a hematoma however shortly after the incident occurred she presented to general surgery and they tried to drain the area.  However it was not draining but a straw-colored fluid.  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  The area will swell when she stands for longer periods then it goes down after she rests.  She has utilize compression socks previously but this has not helped with the swelling of the area.  It is causing significant distress with her daily life as well as with her occupation.  She denies any fevers or chills.  She denies any open wounds or ulcerations.  Today the patient had noninvasive studies which shows evidence of superficial venous reflux in the great saphenous vein at the proximal thigh extending to the knee.  No evidence of DVT or deep venous insufficiency.  No evidence of superficial thrombophlebitis.   Review of Systems  Cardiovascular:  Positive for leg swelling.  All other systems reviewed and are negative.     Objective:   Physical Exam Vitals reviewed.  HENT:     Head: Normocephalic.  Cardiovascular:     Rate and Rhythm: Normal rate.     Pulses: Normal pulses.  Pulmonary:     Effort: Pulmonary effort is normal.  Musculoskeletal:        General: Deformity present.     Left lower leg: Edema present.  Neurological:     Mental Status: She is alert and oriented to person, place, and time.  Psychiatric:        Mood and Affect: Mood normal.        Behavior: Behavior normal.        Thought  Content: Thought content normal.        Judgment: Judgment normal.    BP (!) 177/88   Pulse 71   Ht '5\' 7"'$  (1.702 m)   Wt (!) 371 lb (168.3 kg)   BMI 58.11 kg/m   Past Medical History:  Diagnosis Date   Vitamin D deficiency     Social History   Socioeconomic History   Marital status: Single    Spouse name: Not on file   Number of children: Not on file   Years of education: Not on file   Highest education level: Not on file  Occupational History   Not on file  Tobacco Use   Smoking status: Never   Smokeless tobacco: Never  Vaping Use   Vaping Use: Never used  Substance and Sexual Activity   Alcohol use: Yes    Comment: occationally   Drug use: No   Sexual activity: Not Currently    Birth control/protection: None  Other Topics Concern   Not on file  Social History Narrative   Not on file   Social Determinants of Health   Financial Resource Strain: Not on file  Food Insecurity: Not on file  Transportation Needs: Not on file  Physical Activity: Not on  file  Stress: Not on file  Social Connections: Not on file  Intimate Partner Violence: Not on file    Past Surgical History:  Procedure Laterality Date   WISDOM TOOTH EXTRACTION      Family History  Problem Relation Age of Onset   Hypertension Mother    Hypothyroidism Mother    Hypertension Father     No Known Allergies  CBC Latest Ref Rng & Units 07/28/2020 09/07/2017  WBC 4.0 - 10.5 K/uL 7.4 9.8  Hemoglobin 12.0 - 15.0 g/dL 14.7 15.3  Hematocrit 36.0 - 46.0 % 43.1 45.8  Platelets 150 - 400 K/uL 357 353      CMP     Component Value Date/Time   NA 136 07/28/2020 1006   K 3.8 07/28/2020 1006   CL 101 07/28/2020 1006   CO2 26 07/28/2020 1006   GLUCOSE 105 (H) 07/28/2020 1006   BUN 15 07/28/2020 1006   CREATININE 0.90 07/28/2020 1006   CALCIUM 8.7 (L) 07/28/2020 1006   PROT 6.7 07/28/2020 1006   ALBUMIN 4.0 07/28/2020 1006   AST 23 07/28/2020 1006   ALT 34 07/28/2020 1006   ALKPHOS 93  07/28/2020 1006   BILITOT 0.5 07/28/2020 1006   GFRNONAA >60 07/28/2020 1006   GFRAA >60 07/28/2020 1006     No results found.     Assessment & Plan:   1. 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  Because it is giving her significant distress we discussed surgical removal.  We discussed that due to the size of the wound it would get a wound vacuum post excision and she will likely have a long-term scar.  We discussed the risk, benefits and alternatives.  Patient does agree to proceed with surgery.  We will have the patient follow-up post surgical removal.  2. Pain and swelling of left lower leg The patient also has evidence of venous reflux.  Because the seroma is the most painful portion of her we will focus on that and reevaluate issues with the varicose veins post surgery and healing.   Current Outpatient Medications on File Prior to Visit  Medication Sig Dispense Refill   Calcium 600-400 MG-UNIT CHEW Chew by mouth. (Patient not taking: Reported on 05/17/2021)     ibuprofen (ADVIL) 800 MG tablet Take 1 tablet (800 mg total) by mouth every 8 (eight) hours as needed. 30 tablet 3   levothyroxine (SYNTHROID) 75 MCG tablet Take 1 tablet (75 mcg total) by mouth daily. 90 tablet 3   No current facility-administered medications on file prior to visit.    There are no Patient Instructions on file for this visit. No follow-ups on file.   Kris Hartmann, NP

## 2021-05-22 NOTE — Progress Notes (Signed)
Subjective:    Patient ID: Betty Fisher, female    DOB: Nov 21, 1995, 25 y.o.   MRN: [id number] Chief Complaint  Patient presents with   Follow-up    Pt conv.LLE ven  reflux     Betty Fisher is a 25 year old female that presents today for evaluation of swelling in her left lower extremity.  The patient notes that she has had this swollen area for well over a year.  It occurred post traumatically.  She was initially told that it was a hematoma however shortly after the incident occurred she presented to general surgery and they tried to drain the area.  However it was not draining but a straw-colored fluid.  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  The area will swell when she stands for longer periods then it goes down after she rests.  She has utilize compression socks previously but this has not helped with the swelling of the area.  It is causing significant distress with her daily life as well as with her occupation.  She denies any fevers or chills.  She denies any open wounds or ulcerations.  Today the patient had noninvasive studies which shows evidence of superficial venous reflux in the great saphenous vein at the proximal thigh extending to the knee.  No evidence of DVT or deep venous insufficiency.  No evidence of superficial thrombophlebitis.   Review of Systems  Cardiovascular:  Positive for leg swelling.  All other systems reviewed and are negative.     Objective:   Physical Exam Vitals reviewed.  HENT:     Head: Normocephalic.  Cardiovascular:     Rate and Rhythm: Normal rate.     Pulses: Normal pulses.  Pulmonary:     Effort: Pulmonary effort is normal.  Musculoskeletal:        General: Deformity present.     Left lower leg: Edema present.  Neurological:     Mental Status: She is alert and oriented to person, place, and time.  Psychiatric:        Mood and Affect: Mood normal.        Behavior: Behavior normal.        Thought  Content: Thought content normal.        Judgment: Judgment normal.    BP (!) 177/88   Pulse 71   Ht '5\' 7"'$  (1.702 m)   Wt (!) 371 lb (168.3 kg)   BMI 58.11 kg/m   Past Medical History:  Diagnosis Date   Vitamin D deficiency     Social History   Socioeconomic History   Marital status: Single    Spouse name: Not on file   Number of children: Not on file   Years of education: Not on file   Highest education level: Not on file  Occupational History   Not on file  Tobacco Use   Smoking status: Never   Smokeless tobacco: Never  Vaping Use   Vaping Use: Never used  Substance and Sexual Activity   Alcohol use: Yes    Comment: occationally   Drug use: No   Sexual activity: Not Currently    Birth control/protection: None  Other Topics Concern   Not on file  Social History Narrative   Not on file   Social Determinants of Health   Financial Resource Strain: Not on file  Food Insecurity: Not on file  Transportation Needs: Not on file  Physical Activity: Not on  file  Stress: Not on file  Social Connections: Not on file  Intimate Partner Violence: Not on file    Past Surgical History:  Procedure Laterality Date   WISDOM TOOTH EXTRACTION      Family History  Problem Relation Age of Onset   Hypertension Mother    Hypothyroidism Mother    Hypertension Father     No Known Allergies  CBC Latest Ref Rng & Units 07/28/2020 09/07/2017  WBC 4.0 - 10.5 K/uL 7.4 9.8  Hemoglobin 12.0 - 15.0 g/dL 14.7 15.3  Hematocrit 36.0 - 46.0 % 43.1 45.8  Platelets 150 - 400 K/uL 357 353      CMP     Component Value Date/Time   NA 136 07/28/2020 1006   K 3.8 07/28/2020 1006   CL 101 07/28/2020 1006   CO2 26 07/28/2020 1006   GLUCOSE 105 (H) 07/28/2020 1006   BUN 15 07/28/2020 1006   CREATININE 0.90 07/28/2020 1006   CALCIUM 8.7 (L) 07/28/2020 1006   PROT 6.7 07/28/2020 1006   ALBUMIN 4.0 07/28/2020 1006   AST 23 07/28/2020 1006   ALT 34 07/28/2020 1006   ALKPHOS 93  07/28/2020 1006   BILITOT 0.5 07/28/2020 1006   GFRNONAA >60 07/28/2020 1006   GFRAA >60 07/28/2020 1006     No results found.     Assessment & Plan:   1. 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  Because it is giving her significant distress we discussed surgical removal.  We discussed that due to the size of the wound it would get a wound vacuum post excision and she will likely have a long-term scar.  We discussed the risk, benefits and alternatives.  Patient does agree to proceed with surgery.  We will have the patient follow-up post surgical removal.  2. Pain and swelling of left lower leg The patient also has evidence of venous reflux.  Because the seroma is the most painful portion of her we will focus on that and reevaluate issues with the varicose veins post surgery and healing.   Current Outpatient Medications on File Prior to Visit  Medication Sig Dispense Refill   Calcium 600-400 MG-UNIT CHEW Chew by mouth. (Patient not taking: Reported on 05/17/2021)     ibuprofen (ADVIL) 800 MG tablet Take 1 tablet (800 mg total) by mouth every 8 (eight) hours as needed. 30 tablet 3   levothyroxine (SYNTHROID) 75 MCG tablet Take 1 tablet (75 mcg total) by mouth daily. 90 tablet 3   No current facility-administered medications on file prior to visit.    There are no Patient Instructions on file for this visit. No follow-ups on file.   Kris Hartmann, NP

## 2021-05-23 ENCOUNTER — Telehealth (INDEPENDENT_AMBULATORY_CARE_PROVIDER_SITE_OTHER): Payer: Self-pay | Admitting: Nurse Practitioner

## 2021-05-23 ENCOUNTER — Encounter (INDEPENDENT_AMBULATORY_CARE_PROVIDER_SITE_OTHER): Payer: Self-pay

## 2021-05-23 ENCOUNTER — Telehealth (INDEPENDENT_AMBULATORY_CARE_PROVIDER_SITE_OTHER): Payer: Self-pay

## 2021-05-23 NOTE — Telephone Encounter (Signed)
Called stating she was unsure of surgery date because she will be staying with her friend in [place] after procedure. Patient states that she was told to not be around her 4 small dogs while healing. Patient also wanted to know if Home health would be able to come out to her friends' house in [place] to treat her. I made FB and Mickel Baas aware.

## 2021-05-23 NOTE — Telephone Encounter (Signed)
Patient is scheduled for a right leg seroma removal and wound vac placement with Dr. Delana Meyer on 05/25/21 at [facility]. Pre-surgical instructions were discussed and patient understood. Huey Romans will leave the wound vac with the patient to take to the hospital the day of her surgery. Patient is to call her insurance to find out what home health they use and let us know.

## 2021-05-24 ENCOUNTER — Other Ambulatory Visit (INDEPENDENT_AMBULATORY_CARE_PROVIDER_SITE_OTHER): Payer: Self-pay | Admitting: Nurse Practitioner

## 2021-05-24 ENCOUNTER — Encounter: Payer: Self-pay | Admitting: Physician Assistant

## 2021-05-24 ENCOUNTER — Other Ambulatory Visit
Admission: RE | Admit: 2021-05-24 | Discharge: 2021-05-24 | Disposition: A | Discharge status: Home / Self Care | Payer: 59 | Source: Ambulatory Visit | Attending: Vascular Surgery | Admitting: Vascular Surgery

## 2021-05-24 ENCOUNTER — Other Ambulatory Visit: Payer: Self-pay

## 2021-05-24 ENCOUNTER — Encounter
Admission: RE | Admit: 2021-05-24 | Discharge: 2021-05-24 | Disposition: A | Discharge status: Home / Self Care | Payer: 59 | Source: Ambulatory Visit | Attending: Vascular Surgery | Admitting: Vascular Surgery

## 2021-05-24 ENCOUNTER — Encounter (INDEPENDENT_AMBULATORY_CARE_PROVIDER_SITE_OTHER): Payer: Self-pay

## 2021-05-24 DIAGNOSIS — Z01818 Encounter for other preprocedural examination: Secondary | ICD-10-CM | POA: Insufficient documentation

## 2021-05-24 LAB — CBC WITH DIFFERENTIAL/PLATELET
Abs Immature Granulocytes: 0.04 10*3/uL (ref 0.00–0.07)
Basophils Absolute: 0 10*3/uL (ref 0.0–0.1)
Basophils Relative: 1 %
Eosinophils Absolute: 0.2 10*3/uL (ref 0.0–0.5)
Eosinophils Relative: 2 %
HCT: 44.2 % (ref 36.0–46.0)
Hemoglobin: 14.8 g/dL (ref 12.0–15.0)
Immature Granulocytes: 1 %
Lymphocytes Relative: 34 %
Lymphs Abs: 2.7 10*3/uL (ref 0.7–4.0)
MCH: 30.1 pg (ref 26.0–34.0)
MCHC: 33.5 g/dL (ref 30.0–36.0)
MCV: 90 fL (ref 80.0–100.0)
Monocytes Absolute: 0.6 10*3/uL (ref 0.1–1.0)
Monocytes Relative: 7 %
Neutro Abs: 4.4 10*3/uL (ref 1.7–7.7)
Neutrophils Relative %: 55 %
Platelets: 388 10*3/uL (ref 150–400)
RBC: 4.91 MIL/uL (ref 3.87–5.11)
RDW: 13.2 % (ref 11.5–15.5)
WBC: 7.9 10*3/uL (ref 4.0–10.5)
nRBC: 0 % (ref 0.0–0.2)

## 2021-05-24 LAB — BASIC METABOLIC PANEL
Anion gap: 10 (ref 5–15)
BUN: 17 mg/dL (ref 6–20)
CO2: 25 mmol/L (ref 22–32)
Calcium: 9 mg/dL (ref 8.9–10.3)
Chloride: 104 mmol/L (ref 98–111)
Creatinine, Ser: 0.88 mg/dL (ref 0.44–1.00)
GFR, Estimated: >60 mL/min (ref >60)
Glucose, Bld: 96 mg/dL (ref 70–99)
Potassium: 4 mmol/L (ref 3.5–5.1)
Sodium: 139 mmol/L (ref 135–145)

## 2021-05-24 NOTE — Patient Instructions (Signed)
Your procedure is scheduled on: Wednesday May 25, 2021. Report to Day Surgery inside [facility] 2nd floor stop by admissions desk first before getting on elevator. To find out your arrival time please call [phone] between 1PM - 3PM on Tuesday May 24, 2021.  Remember: Instructions that are not followed completely may result in serious medical risk,  up to and including death, or upon the discretion of your surgeon and anesthesiologist your  surgery may need to be rescheduled.     _X__ 1. Do not eat food or drink fluids after midnight the night before your procedure.                 No chewing gum or hard candies.   __X__2.  On the morning of surgery brush your teeth with toothpaste and water, you                may rinse your mouth with mouthwash if you wish.  Do not swallow any toothpaste of mouthwash.     _X__ 3.  No Alcohol for 24 hours before or after surgery.   _X__ 4.  Do Not Smoke or use e-cigarettes For 24 Hours Prior to Your Surgery.                 Do not use any chewable tobacco products for at least 6 hours prior to                 Surgery.  _X__  5.  Do not use any recreational drugs (marijuana, cocaine, heroin, ecstasy, MDMA or other)                For at least one week prior to your surgery.  Combination of these drugs with anesthesia                May have life threatening results.  __X__6.  Notify your doctor if there is any change in your medical condition      (cold, fever, infections).     Do not wear jewelry, make-up, hairpins, clips or nail polish. Do not wear lotions, powders, or perfumes. You may wear deodorant. Do not shave 48 hours prior to surgery.  Do not bring valuables to the hospital.    [facility] is not responsible for any belongings or valuables.  Contacts, dentures or bridgework may not be worn into surgery. Leave your suitcase in the car. After surgery it may be brought to your room. For patients admitted to  the hospital, discharge time is determined by your treatment team.   Patients discharged the day of surgery will not be allowed to drive home.   Make arrangements for someone to be with you for the first 24 hours of your Same Day Discharge.   __X__ Take these medicines the morning of surgery with A SIP OF WATER:    1. levothyroxine (SYNTHROID) 75 MCG  2.   3.   4.  5.  6.  ____ Fleet Enema (as directed)   __X__ Use CHG Soap (or wipes) as directed  ____ Use Benzoyl Peroxide Gel as instructed  ____ Use inhalers on the day of surgery  ____ Stop metformin 2 days prior to surgery    ____ Take 1/2 of usual insulin dose the night before surgery. No insulin the morning          of surgery.   ____ Call your PCP, cardiologist, or Pulmonologist if taking Coumadin/Plavix/aspirin and ask when to stop  before your surgery.   __X__ One Week prior to surgery- Stop Anti-inflammatories such as Ibuprofen, Aleve, Advil, Motrin, meloxicam (MOBIC), diclofenac, etodolac, ketorolac, Toradol, Daypro, piroxicam, Goody's or BC powders. OK TO USE TYLENOL IF NEEDED   __X__ Stop supplements until after surgery.    ____ Bring C-Pap to the hospital.    If you have any questions regarding your pre-procedure instructions,  Please call Pre-admit Testing at [phone].

## 2021-05-25 ENCOUNTER — Ambulatory Visit: Payer: 59 | Admitting: Anesthesiology

## 2021-05-25 ENCOUNTER — Encounter: Payer: Self-pay | Admitting: Vascular Surgery

## 2021-05-25 ENCOUNTER — Encounter
Admission: RE | Disposition: A | Discharge status: Home / Self Care | Payer: Self-pay | Source: Home / Self Care | Attending: Vascular Surgery

## 2021-05-25 ENCOUNTER — Ambulatory Visit
Admission: RE | Admit: 2021-05-25 | Discharge: 2021-05-25 | Disposition: A | Discharge status: Home / Self Care | Payer: 59 | Attending: Vascular Surgery | Admitting: Vascular Surgery

## 2021-05-25 DIAGNOSIS — Z79890 Hormone replacement therapy (postmenopausal): Secondary | ICD-10-CM | POA: Diagnosis not present

## 2021-05-25 DIAGNOSIS — S8012XA Contusion of left lower leg, initial encounter: Secondary | ICD-10-CM | POA: Diagnosis present

## 2021-05-25 DIAGNOSIS — M7989 Other specified soft tissue disorders: Secondary | ICD-10-CM | POA: Insufficient documentation

## 2021-05-25 DIAGNOSIS — Z8249 Family history of ischemic heart disease and other diseases of the circulatory system: Secondary | ICD-10-CM | POA: Insufficient documentation

## 2021-05-25 DIAGNOSIS — T792XXD Traumatic secondary and recurrent hemorrhage and seroma, subsequent encounter: Secondary | ICD-10-CM

## 2021-05-25 DIAGNOSIS — W19XXXD Unspecified fall, subsequent encounter: Secondary | ICD-10-CM

## 2021-05-25 DIAGNOSIS — T792XXA Traumatic secondary and recurrent hemorrhage and seroma, initial encounter: Secondary | ICD-10-CM

## 2021-05-25 HISTORY — PX: INCISION AND DRAINAGE ABSCESS: SHX5864

## 2021-05-25 LAB — TYPE AND SCREEN
ABO/RH(D): A POS
Antibody Screen: NEGATIVE

## 2021-05-25 SURGERY — INCISION AND DRAINAGE, ABSCESS
Anesthesia: General | Site: Leg Lower | Laterality: Left

## 2021-05-25 MED ORDER — MIDAZOLAM HCL 2 MG/2ML IJ SOLN
INTRAMUSCULAR | Status: AC
  Filled 2021-05-25: qty 2

## 2021-05-25 MED ORDER — CEFAZOLIN IN SODIUM CHLORIDE 3-0.9 GM/100ML-% IV SOLN
3.0000 g | INTRAVENOUS | Status: AC
  Administered 2021-05-25: 3 g via INTRAVENOUS
  Filled 2021-05-25: qty 100

## 2021-05-25 MED ORDER — OXYCODONE HCL 5 MG PO TABS
ORAL_TABLET | ORAL | Status: AC
  Filled 2021-05-25: qty 1

## 2021-05-25 MED ORDER — BUPIVACAINE-EPINEPHRINE 0.5% -1:200000 IJ SOLN
INTRAMUSCULAR | Status: DC | PRN
  Administered 2021-05-25: 20 mL

## 2021-05-25 MED ORDER — PROPOFOL 10 MG/ML IV BOLUS
INTRAVENOUS | Status: DC | PRN
  Administered 2021-05-25: 200 mg via INTRAVENOUS

## 2021-05-25 MED ORDER — FENTANYL CITRATE (PF) 100 MCG/2ML IJ SOLN
25.0000 ug | INTRAMUSCULAR | Status: DC | PRN
  Administered 2021-05-25: 25 ug via INTRAVENOUS

## 2021-05-25 MED ORDER — FAMOTIDINE 20 MG PO TABS
ORAL_TABLET | ORAL | Status: AC
  Administered 2021-05-25: 20 mg via ORAL
  Filled 2021-05-25: qty 1

## 2021-05-25 MED ORDER — FENTANYL CITRATE (PF) 100 MCG/2ML IJ SOLN
INTRAMUSCULAR | Status: DC | PRN
  Administered 2021-05-25 (×2): 50 ug via INTRAVENOUS

## 2021-05-25 MED ORDER — ONDANSETRON HCL 4 MG/2ML IJ SOLN
INTRAMUSCULAR | Status: DC | PRN
  Administered 2021-05-25: 4 mg via INTRAVENOUS

## 2021-05-25 MED ORDER — OXYCODONE HCL 5 MG PO TABS
5.0000 mg | ORAL_TABLET | Freq: Once | ORAL | Status: AC
  Administered 2021-05-25: 5 mg via ORAL

## 2021-05-25 MED ORDER — HYDROCODONE-ACETAMINOPHEN 5-325 MG PO TABS: 1.0000 | ORAL_TABLET | Freq: Four times a day (QID) | ORAL | 0 refills | Status: DC | PRN

## 2021-05-25 MED ORDER — CHLORHEXIDINE GLUCONATE 0.12 % MT SOLN
OROMUCOSAL | Status: AC
  Administered 2021-05-25: 15 mL via OROMUCOSAL
  Filled 2021-05-25: qty 15

## 2021-05-25 MED ORDER — CHLORHEXIDINE GLUCONATE CLOTH 2 % EX PADS: 6.0000 | MEDICATED_PAD | Freq: Once | CUTANEOUS | Status: DC

## 2021-05-25 MED ORDER — ORAL CARE MOUTH RINSE: 15.0000 mL | Freq: Once | OROMUCOSAL | Status: AC

## 2021-05-25 MED ORDER — CHLORHEXIDINE GLUCONATE CLOTH 2 % EX PADS
6.0000 | MEDICATED_PAD | Freq: Once | CUTANEOUS | Status: AC
  Administered 2021-05-25: 6 via TOPICAL

## 2021-05-25 MED ORDER — LACTATED RINGERS IV SOLN: INTRAVENOUS | Status: DC

## 2021-05-25 MED ORDER — FENTANYL CITRATE (PF) 100 MCG/2ML IJ SOLN
INTRAMUSCULAR | Status: AC
  Administered 2021-05-25: 25 ug via INTRAVENOUS
  Filled 2021-05-25: qty 2

## 2021-05-25 MED ORDER — FAMOTIDINE 20 MG PO TABS: 20.0000 mg | ORAL_TABLET | Freq: Once | ORAL | Status: AC

## 2021-05-25 MED ORDER — CHLORHEXIDINE GLUCONATE 0.12 % MT SOLN: 15.0000 mL | Freq: Once | OROMUCOSAL | Status: AC

## 2021-05-25 MED ORDER — 0.9 % SODIUM CHLORIDE (POUR BTL) OPTIME
TOPICAL | Status: DC | PRN
  Administered 2021-05-25: 1000 mL

## 2021-05-25 MED ORDER — LIDOCAINE HCL (CARDIAC) PF 100 MG/5ML IV SOSY
PREFILLED_SYRINGE | INTRAVENOUS | Status: DC | PRN
  Administered 2021-05-25: 100 mg via INTRAVENOUS

## 2021-05-25 MED ORDER — DEXAMETHASONE SODIUM PHOSPHATE 10 MG/ML IJ SOLN
INTRAMUSCULAR | Status: DC | PRN
  Administered 2021-05-25: 10 mg via INTRAVENOUS

## 2021-05-25 MED ORDER — PROPOFOL 10 MG/ML IV BOLUS
INTRAVENOUS | Status: AC
  Filled 2021-05-25: qty 20

## 2021-05-25 MED ORDER — FENTANYL CITRATE (PF) 100 MCG/2ML IJ SOLN
INTRAMUSCULAR | Status: AC
  Filled 2021-05-25: qty 2

## 2021-05-25 MED ORDER — MIDAZOLAM HCL 2 MG/2ML IJ SOLN
INTRAMUSCULAR | Status: DC | PRN
  Administered 2021-05-25 (×2): 1 mg via INTRAVENOUS

## 2021-05-25 SURGICAL SUPPLY — 40 items
APL PRP STRL LF DISP 70% ISPRP (MISCELLANEOUS) ×2
BNDG COHESIVE 6X5 TAN ST LF (GAUZE/BANDAGES/DRESSINGS) ×3 IMPLANT
BNDG CONFORM 2 STRL LF (GAUZE/BANDAGES/DRESSINGS) IMPLANT
CANISTER SUCT 1200ML W/VALVE (MISCELLANEOUS) ×3 IMPLANT
CANISTER WOUND CARE 500ML ATS (WOUND CARE) IMPLANT
CHLORAPREP W/TINT 26 (MISCELLANEOUS) ×3 IMPLANT
DRAPE EXTREMITY 106X87X128.5 (DRAPES) IMPLANT
DRAPE INCISE IOBAN 66X45 STRL (DRAPES) ×3 IMPLANT
DRSG EMULSION OIL 3X3 NADH (GAUZE/BANDAGES/DRESSINGS) ×3 IMPLANT
DRSG OPSITE POSTOP 4X6 (GAUZE/BANDAGES/DRESSINGS) ×3 IMPLANT
DRSG VAC ATS LRG SENSATRAC (GAUZE/BANDAGES/DRESSINGS) IMPLANT
DRSG VAC ATS MED SENSATRAC (GAUZE/BANDAGES/DRESSINGS) IMPLANT
ELECT REM PT RETURN 9FT ADLT (ELECTROSURGICAL) ×3
ELECTRODE REM PT RTRN 9FT ADLT (ELECTROSURGICAL) ×2 IMPLANT
GAUZE 4X4 16PLY ~~LOC~~+RFID DBL (SPONGE) ×3 IMPLANT
GAUZE SPONGE 4X4 12PLY STRL (GAUZE/BANDAGES/DRESSINGS) IMPLANT
GLOVE SURG ENC MOIS LTX SZ7 (GLOVE) ×3 IMPLANT
GLOVE SURG SYN 8.0 (GLOVE) ×3 IMPLANT
GLOVE SURG UNDER LTX SZ7.5 (GLOVE) ×3 IMPLANT
GOWN STRL REUS W/ TWL LRG LVL3 (GOWN DISPOSABLE) ×2 IMPLANT
GOWN STRL REUS W/ TWL XL LVL3 (GOWN DISPOSABLE) ×4 IMPLANT
GOWN STRL REUS W/TWL LRG LVL3 (GOWN DISPOSABLE) ×3
GOWN STRL REUS W/TWL XL LVL3 (GOWN DISPOSABLE) ×6
JACKSON PRATT 7MM (INSTRUMENTS) ×3 IMPLANT
KIT TURNOVER KIT A (KITS) ×3 IMPLANT
LABEL OR SOLS (LABEL) ×3 IMPLANT
MANIFOLD NEPTUNE II (INSTRUMENTS) ×3 IMPLANT
NS IRRIG 500ML POUR BTL (IV SOLUTION) ×3 IMPLANT
PACK EXTREMITY ARMC (MISCELLANEOUS) ×3 IMPLANT
PAD PREP 24X41 OB/GYN DISP (PERSONAL CARE ITEMS) ×3 IMPLANT
SOL PREP PVP 2OZ (MISCELLANEOUS) ×3
SOLUTION PREP PVP 2OZ (MISCELLANEOUS) ×2 IMPLANT
SPONGE DRAIN TRACH 4X4 STRL 2S (GAUZE/BANDAGES/DRESSINGS) ×3 IMPLANT
STOCKINETTE IMPERV 14X48 (MISCELLANEOUS) ×3 IMPLANT
SUT ETHILON 3-0 FS-10 30 BLK (SUTURE) ×3
SUT ETHILON NAB PS2 4-0 18IN (SUTURE) ×3 IMPLANT
SUT VIC AB 2-0 CT1 (SUTURE) ×3 IMPLANT
SUT VIC AB 3-0 SH 27 (SUTURE) ×3
SUT VIC AB 3-0 SH 27X BRD (SUTURE) ×2 IMPLANT
SUTURE EHLN 3-0 FS-10 30 BLK (SUTURE) ×2 IMPLANT

## 2021-05-25 NOTE — Interval H&P Note (Signed)
History and Physical Interval Note:  05/25/2021 10:48 AM  Betty Fisher  has presented today for surgery, with the diagnosis of POST TRAUMATIC SEROMA.  The various methods of treatment have been discussed with the patient and family. After consideration of risks, benefits and other options for treatment, the patient has consented to  Procedure(s): INCISION AND DRAINAGE ABSCESS (SEROMA) (Left) APPLICATION OF WOUND VAC (Left) as a surgical intervention.  The patient's history has been reviewed, patient examined, no change in status, stable for surgery.  I have reviewed the patient's chart and labs.  Questions were answered to the patient's satisfaction.     [name]

## 2021-05-25 NOTE — Anesthesia Procedure Notes (Signed)
Procedure Name: LMA Insertion Date/Time: 05/25/2021 11:27 AM Performed by: Leander Rams, CRNA Pre-anesthesia Checklist: Patient being monitored, Timeout performed, Emergency Drugs available, Suction available and Patient identified Patient Re-evaluated:Patient Re-evaluated prior to induction Oxygen Delivery Method: Circle system utilized Preoxygenation: Pre-oxygenation with 100% oxygen Induction Type: IV induction Ventilation: Mask ventilation without difficulty LMA: LMA inserted LMA Size: 4.0 Tube type: Oral Number of attempts: 1 Placement Confirmation: positive ETCO2 and breath sounds checked- equal and bilateral Tube secured with: Tape Dental Injury: Teeth and Oropharynx as per pre-operative assessment

## 2021-05-25 NOTE — Progress Notes (Addendum)
Per patient, she has not had sexual intercourse in the last year and is currently on her period. Per Dr. Delana Meyer and Dr. Andree Elk, pt. is ok to continue with surgery without u-preg.

## 2021-05-25 NOTE — Transfer of Care (Signed)
Immediate Anesthesia Transfer of Care Note  Patient: Betty Fisher  Procedure(s) Performed: INCISION AND DRAINAGE SEROMA (Left: Leg Lower)  Patient Location: PACU  Anesthesia Type:General  Level of Consciousness: awake  Airway & Oxygen Therapy: Patient Spontanous Breathing  Post-op Assessment: Report given to RN  Post vital signs: stable  Last Vitals:  Vitals Value Taken Time  BP 125/81 05/25/21 1245  Temp 36.6 C 05/25/21 1245  Pulse 67 05/25/21 1246  Resp 11 05/25/21 1246  SpO2 100 % 05/25/21 1246  Vitals shown include unvalidated device data.  Last Pain:  Vitals:   05/25/21 0914  TempSrc: Oral  PainSc: 0-No pain         Complications: No notable events documented.

## 2021-05-25 NOTE — Telephone Encounter (Signed)
Eulogio Ditch NP spoke with the patient regarding this situation. See notes below.

## 2021-05-25 NOTE — Anesthesia Preprocedure Evaluation (Signed)
Anesthesia Evaluation  Patient identified by MRN, date of birth, ID band Patient awake    Reviewed: Allergy & Precautions, H&P , NPO status , Patient's Chart, lab work & pertinent test results, reviewed documented beta blocker date and time   Airway Mallampati: III  TM Distance: >3 FB Neck ROM: full    Dental  (+) Teeth Intact   Pulmonary neg pulmonary ROS,    Pulmonary exam normal        Cardiovascular Exercise Tolerance: Poor negative cardio ROS Normal cardiovascular exam Rhythm:regular Rate:Normal     Neuro/Psych negative neurological ROS  negative psych ROS   GI/Hepatic negative GI ROS, Neg liver ROS,   Endo/Other  Morbid obesity  Renal/GU negative Renal ROS  negative genitourinary   Musculoskeletal   Abdominal   Peds  Hematology negative hematology ROS (+)   Anesthesia Other Findings Past Medical History: No date: Vitamin D deficiency Past Surgical History: No date: WISDOM TOOTH EXTRACTION BMI    Body Mass Index: 57.95 kg/m     Reproductive/Obstetrics negative OB ROS                             Anesthesia Physical Anesthesia Plan  ASA: 3  Anesthesia Plan: General ETT   Post-op Pain Management:    Induction:   PONV Risk Score and Plan: 4 or greater  Airway Management Planned:   Additional Equipment:   Intra-op Plan:   Post-operative Plan:   Informed Consent: I have reviewed the patients History and Physical, chart, labs and discussed the procedure including the risks, benefits and alternatives for the proposed anesthesia with the patient or authorized representative who has indicated his/her understanding and acceptance.     Dental Advisory Given  Plan Discussed with: CRNA  Anesthesia Plan Comments:         Anesthesia Quick Evaluation

## 2021-05-25 NOTE — Discharge Instructions (Signed)
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
## 2021-05-26 ENCOUNTER — Telehealth (INDEPENDENT_AMBULATORY_CARE_PROVIDER_SITE_OTHER): Payer: Self-pay

## 2021-05-26 LAB — SURGICAL PATHOLOGY

## 2021-05-26 NOTE — Telephone Encounter (Signed)
Patient left a voicemail stating yesterday she had active drain place and is having some clear drainage coming down her leg. I spoke with Dr Delana Meyer and he recommended that some drainage is to expected. The patient should continue with directions with using the suction bulb. Patient was made aware with medical recommendations and verbalized understanding.

## 2021-05-27 ENCOUNTER — Encounter (INDEPENDENT_AMBULATORY_CARE_PROVIDER_SITE_OTHER): Payer: Self-pay

## 2021-05-27 NOTE — Telephone Encounter (Signed)
Patient called in today about her dressings being wet, and feels uncomfortable changing her own dressings.  She wants to know if she can go to urgent care to get those changed out?  She also states that one of her disposable stitches has fell off and wanted to know if that was normal.  Please advise.

## 2021-05-27 NOTE — Telephone Encounter (Signed)
Called patient and discussed directly

## 2021-05-30 NOTE — Anesthesia Postprocedure Evaluation (Signed)
Anesthesia Post Note  Patient: Betty Fisher  Procedure(s) Performed: INCISION AND DRAINAGE SEROMA (Left: Leg Lower)  Patient location during evaluation: PACU Anesthesia Type: General Level of consciousness: awake and alert Pain management: pain level controlled Vital Signs Assessment: post-procedure vital signs reviewed and stable Respiratory status: spontaneous breathing, nonlabored ventilation, respiratory function stable and patient connected to nasal cannula oxygen Cardiovascular status: blood pressure returned to baseline and stable Postop Assessment: no apparent nausea or vomiting Anesthetic complications: no   No notable events documented.   Last Vitals:  Vitals:   05/25/21 1345 05/25/21 1346  BP: 122/80 (!) 143/89  Pulse: 76 87  Resp: 15 18  Temp: (!) 36.3 C 36.8 C  SpO2: 97% 100%    Last Pain:  Vitals:   05/25/21 1346  TempSrc: Temporal  PainSc: Malone Riki Gehring

## 2021-06-01 ENCOUNTER — Telehealth (INDEPENDENT_AMBULATORY_CARE_PROVIDER_SITE_OTHER): Payer: Self-pay

## 2021-06-01 NOTE — Telephone Encounter (Signed)
Patient called stating that where the fluid comes out has clotted. The fluid is moving slower and she having some swelling. She normally drain 60m of fluid but now its 35. Patient will be keeping her upcoming appointment

## 2021-06-02 ENCOUNTER — Ambulatory Visit (INDEPENDENT_AMBULATORY_CARE_PROVIDER_SITE_OTHER): Payer: 59 | Admitting: Vascular Surgery

## 2021-06-02 ENCOUNTER — Other Ambulatory Visit: Payer: Self-pay

## 2021-06-02 VITALS — BP 148/99 | HR 82 | Ht 67.0 in | Wt 372.0 lb

## 2021-06-02 DIAGNOSIS — T792XXA Traumatic secondary and recurrent hemorrhage and seroma, initial encounter: Secondary | ICD-10-CM

## 2021-06-04 ENCOUNTER — Encounter (INDEPENDENT_AMBULATORY_CARE_PROVIDER_SITE_OTHER): Payer: Self-pay | Admitting: Vascular Surgery

## 2021-06-04 NOTE — Progress Notes (Signed)
    Patient ID: Betty Fisher, female   DOB: [birth year], 25 y.o.   MRN: [id number]  Chief Complaint  Patient presents with   Follow-up    1 wk [facility] post incision and drainage seroma     HPI Betty Fisher is a 25 y.o. female.    Patient denies severe pain  Drainage is about 80 cc/day   Past Medical History:  Diagnosis Date   Vitamin D deficiency     Past Surgical History:  Procedure Laterality Date   INCISION AND DRAINAGE ABSCESS Left 05/25/2021   Procedure: INCISION AND DRAINAGE SEROMA;  Surgeon: Katha Cabal, MD;  Location: [facility] ORS;  Service: Vascular;  Laterality: Left;   WISDOM TOOTH EXTRACTION        No Known Allergies  Current Outpatient Medications  Medication Sig Dispense Refill   aspirin 81 MG EC tablet Take 81 mg by mouth daily. Swallow whole.     HYDROcodone-acetaminophen (NORCO) 5-325 MG tablet Take 1-2 tablets by mouth every 6 (six) hours as needed for moderate pain or severe pain. 46 tablet 0   ibuprofen (ADVIL) 800 MG tablet Take 1 tablet (800 mg total) by mouth every 8 (eight) hours as needed. (Patient taking differently: Take 800 mg by mouth every 8 (eight) hours as needed for cramping (menstrual pain relief).) 30 tablet 3   levothyroxine (SYNTHROID) 75 MCG tablet Take 1 tablet (75 mcg total) by mouth daily. (Patient taking differently: Take 75 mcg by mouth daily before breakfast.) 90 tablet 3   No current facility-administered medications for this visit.        Physical Exam BP (!) 148/99   Pulse 82   Ht '5\' 7"'$  (1.702 m)   Wt (!) 372 lb (168.7 kg)   LMP 05/24/2021 (Exact Date)   BMI 58.26 kg/m  Gen:  WD/WN, NAD Skin: incision C/D/I; bulb is intact     Assessment/Plan:   Seroma Continue the bulb for now Sutures to stay in for about 10 more days     Betty Fisher 06/04/2021, 1:52 PM   This note was created with Dragon medical transcription system.  Any errors from dictation are unintentional.

## 2021-06-08 ENCOUNTER — Encounter (INDEPENDENT_AMBULATORY_CARE_PROVIDER_SITE_OTHER): Payer: Self-pay

## 2021-06-09 ENCOUNTER — Telehealth (INDEPENDENT_AMBULATORY_CARE_PROVIDER_SITE_OTHER): Payer: Self-pay | Admitting: Nurse Practitioner

## 2021-06-09 ENCOUNTER — Encounter (INDEPENDENT_AMBULATORY_CARE_PROVIDER_SITE_OTHER): Payer: Self-pay

## 2021-06-09 NOTE — Telephone Encounter (Signed)
Called stating that she has been in severe pain and so she went to [facility] to have her leg evaluated. Patient states that the providers there advised her that the leg looks fine and they believe she is just going through another phase of the healing process that it more painful than the last. Patient called to make [place] aware. Patient is due to come in to be seen 06/15/21.   This note is for documentation purposes only.

## 2021-06-13 ENCOUNTER — Ambulatory Visit (INDEPENDENT_AMBULATORY_CARE_PROVIDER_SITE_OTHER): Payer: 59 | Admitting: Vascular Surgery

## 2021-06-15 ENCOUNTER — Ambulatory Visit (INDEPENDENT_AMBULATORY_CARE_PROVIDER_SITE_OTHER): Payer: 59 | Admitting: Nurse Practitioner

## 2021-06-15 ENCOUNTER — Other Ambulatory Visit: Payer: Self-pay

## 2021-06-15 ENCOUNTER — Encounter (INDEPENDENT_AMBULATORY_CARE_PROVIDER_SITE_OTHER): Payer: Self-pay | Admitting: Nurse Practitioner

## 2021-06-15 VITALS — BP 142/93 | HR 83 | Resp 16 | Wt 371.0 lb

## 2021-06-15 DIAGNOSIS — T792XXA Traumatic secondary and recurrent hemorrhage and seroma, initial encounter: Secondary | ICD-10-CM

## 2021-06-15 NOTE — Progress Notes (Signed)
Subjective:    Patient ID: Betty Fisher, female    DOB: 05-30-1996, 25 y.o.   MRN: [id number] Chief Complaint  Patient presents with   Follow-up    1 1/2 wk follow up    Betty Fisher is a 25 year old female that presents today for wound evaluation after posttraumatic seroma drainage.  The patient had an episode where she began to have redness and swelling and throbbing pain.  She went to the emergency room at [facility] for evaluation and there was no DVT identified.  No evidence of infection seen.  Since that time the patient has been feeling much better and she has been elevating her lower extremities more.  She has been keeping a daily log of the drainage from her JP drain and she is averaging 100 to 150 cc daily.  She endorses some tenderness but nothing significant.  The distal incision is clean dry and intact with sutures still in place.  There is no dehiscence and the wound is healing well.  The insertion site over the JP drain is also doing well.  Overall she is progressing as expected.   Review of Systems  Cardiovascular:  Positive for leg swelling.  Skin:  Positive for wound.  All other systems reviewed and are negative.     Objective:   Physical Exam Vitals reviewed.  HENT:     Head: Normocephalic.  Cardiovascular:     Rate and Rhythm: Normal rate.  Pulmonary:     Effort: Pulmonary effort is normal.  Musculoskeletal:        General: Tenderness present.     Left lower leg: No edema.  Skin:    General: Skin is warm and dry.     Findings: No erythema.  Neurological:     Mental Status: She is alert and oriented to person, place, and time.  Psychiatric:        Mood and Affect: Mood normal.        Behavior: Behavior normal.        Thought Content: Thought content normal.        Judgment: Judgment normal.    BP (!) 142/93 (BP Location: Right Arm)   Pulse 83   Resp 16   Wt (!) 371 lb (168.3 kg)   LMP 05/24/2021 (Exact Date)   BMI 58.11 kg/m   Past Medical  History:  Diagnosis Date   Vitamin D deficiency     Social History   Socioeconomic History   Marital status: Single    Spouse name: Not on file   Number of children: Not on file   Years of education: Not on file   Highest education level: Not on file  Occupational History   Not on file  Tobacco Use   Smoking status: Never   Smokeless tobacco: Never  Vaping Use   Vaping Use: Never used  Substance and Sexual Activity   Alcohol use: Yes    Alcohol/week: 1.0 standard drink    Types: 1 Glasses of wine per week    Comment: occationally   Drug use: No   Sexual activity: Not Currently    Birth control/protection: None  Other Topics Concern   Not on file  Social History Narrative   Not on file   Social Determinants of Health   Financial Resource Strain: Not on file  Food Insecurity: Not on file  Transportation Needs: Not on file  Physical Activity: Not on file  Stress: Not on file  Social Connections: Not  on file  Intimate Partner Violence: Not on file    Past Surgical History:  Procedure Laterality Date   INCISION AND DRAINAGE ABSCESS Left 05/25/2021   Procedure: INCISION AND DRAINAGE SEROMA;  Surgeon: Katha Cabal, MD;  Location: [facility] ORS;  Service: Vascular;  Laterality: Left;   WISDOM TOOTH EXTRACTION      Family History  Problem Relation Age of Onset   Hypertension Mother    Hypothyroidism Mother    Hypertension Father     No Known Allergies  CBC Latest Ref Rng & Units 05/24/2021 07/28/2020 09/07/2017  WBC 4.0 - 10.5 K/uL 7.9 7.4 9.8  Hemoglobin 12.0 - 15.0 g/dL 14.8 14.7 15.3  Hematocrit 36.0 - 46.0 % 44.2 43.1 45.8  Platelets 150 - 400 K/uL 388 357 353      CMP     Component Value Date/Time   NA 139 05/24/2021 1452   K 4.0 05/24/2021 1452   CL 104 05/24/2021 1452   CO2 25 05/24/2021 1452   GLUCOSE 96 05/24/2021 1452   BUN 17 05/24/2021 1452   CREATININE 0.88 05/24/2021 1452   CALCIUM 9.0 05/24/2021 1452   PROT 6.7 07/28/2020 1006    ALBUMIN 4.0 07/28/2020 1006   AST 23 07/28/2020 1006   ALT 34 07/28/2020 1006   ALKPHOS 93 07/28/2020 1006   BILITOT 0.5 07/28/2020 1006   GFRNONAA >60 05/24/2021 1452   GFRAA >60 07/28/2020 1006     No results found.     Assessment & Plan:   1. Posttraumatic seroma ([place]) Overall the patient is doing well.  Sutures were removed today from the distal incision and she tolerated this well.  The area remained intact and Steri-Strips were applied.  No evidence of infection or dehiscence was seen.  The patient continues to have her drain in place as well.  She is averaging about 100 250 cc of drainage daily still.  We will continue to keep this drain in place until the drain is measuring less than 25 cc consistently daily.  Patient is advised to continue using iodine around the wound and continue with emptying bulb as previously instructed.  We will have the patient return in 2 weeks for reevaluation or sooner if needed.   Current Outpatient Medications on File Prior to Visit  Medication Sig Dispense Refill   aspirin 81 MG EC tablet Take 81 mg by mouth daily. Swallow whole.     HYDROcodone-acetaminophen (NORCO) 5-325 MG tablet Take 1-2 tablets by mouth every 6 (six) hours as needed for moderate pain or severe pain. 46 tablet 0   ibuprofen (ADVIL) 800 MG tablet Take 1 tablet (800 mg total) by mouth every 8 (eight) hours as needed. (Patient taking differently: Take 800 mg by mouth every 8 (eight) hours as needed for cramping (menstrual pain relief).) 30 tablet 3   levothyroxine (SYNTHROID) 75 MCG tablet Take 1 tablet (75 mcg total) by mouth daily. (Patient taking differently: Take 75 mcg by mouth daily before breakfast.) 90 tablet 3   No current facility-administered medications on file prior to visit.    There are no Patient Instructions on file for this visit. No follow-ups on file.   Kris Hartmann, NP

## 2021-06-24 ENCOUNTER — Encounter (INDEPENDENT_AMBULATORY_CARE_PROVIDER_SITE_OTHER): Payer: Self-pay

## 2021-06-25 DIAGNOSIS — L03116 Cellulitis of left lower limb: Secondary | ICD-10-CM | POA: Insufficient documentation

## 2021-06-27 ENCOUNTER — Telehealth: Payer: Self-pay

## 2021-06-27 NOTE — Telephone Encounter (Signed)
Patient is currently at [facility] for infection in her leg. The patient gave the provider Dr Delana Meyer name and number to reach at the office. Dr Delana Meyer will be giving provider information once call has been received.

## 2021-06-27 NOTE — Telephone Encounter (Signed)
Pt called that she in the hospital for infection in her leg she said they did labs it showed low calcium they like her to take vitamin D advised her that when she discharged from hospital she can make hospital follow appt

## 2021-06-29 ENCOUNTER — Telehealth (INDEPENDENT_AMBULATORY_CARE_PROVIDER_SITE_OTHER): Payer: Self-pay

## 2021-06-29 NOTE — Telephone Encounter (Signed)
I returned the pt's call

## 2021-06-29 NOTE — Telephone Encounter (Signed)
I spoke with the pt and made her aware that the message has been given to the provider an he will be in touch with Dr. Nelwyn Salisbury at [facility] that we have had a lot emergency situation and it might not be today but when he can . The pt made ,e aware that they have take off her drain and found 5 types of infection in her leg per the pt.

## 2021-06-30 ENCOUNTER — Ambulatory Visit (INDEPENDENT_AMBULATORY_CARE_PROVIDER_SITE_OTHER): Payer: 59 | Admitting: Vascular Surgery

## 2021-07-05 ENCOUNTER — Telehealth (INDEPENDENT_AMBULATORY_CARE_PROVIDER_SITE_OTHER): Payer: Self-pay | Admitting: Vascular Surgery

## 2021-07-05 NOTE — Telephone Encounter (Signed)
Pt calling to state she is still @ [facility]. Friday went into the same incision and took out 1 cm of "pus" . Monday they started wound vac. She is just wanting to update Dr. Delana Meyer to let him know. Thanks.

## 2021-07-07 DIAGNOSIS — L02419 Cutaneous abscess of limb, unspecified: Secondary | ICD-10-CM | POA: Insufficient documentation

## 2021-07-26 ENCOUNTER — Other Ambulatory Visit: Payer: 59 | Admitting: Internal Medicine

## 2021-10-05 ENCOUNTER — Other Ambulatory Visit (HOSPITAL_COMMUNITY)
Admission: RE | Admit: 2021-10-05 | Discharge: 2021-10-05 | Disposition: A | Discharge status: Home / Self Care | Payer: 59 | Source: Ambulatory Visit | Attending: Obstetrics and Gynecology | Admitting: Obstetrics and Gynecology

## 2021-10-05 ENCOUNTER — Encounter: Payer: Self-pay | Admitting: Obstetrics and Gynecology

## 2021-10-05 ENCOUNTER — Ambulatory Visit (INDEPENDENT_AMBULATORY_CARE_PROVIDER_SITE_OTHER): Payer: 59 | Admitting: Obstetrics and Gynecology

## 2021-10-05 ENCOUNTER — Other Ambulatory Visit: Payer: Self-pay

## 2021-10-05 VITALS — BP 128/86 | HR 106 | Ht 67.0 in | Wt 366.0 lb

## 2021-10-05 DIAGNOSIS — Z124 Encounter for screening for malignant neoplasm of cervix: Secondary | ICD-10-CM | POA: Insufficient documentation

## 2021-10-05 DIAGNOSIS — Z01419 Encounter for gynecological examination (general) (routine) without abnormal findings: Secondary | ICD-10-CM | POA: Diagnosis not present

## 2021-10-05 NOTE — Progress Notes (Signed)
Gynecology Annual Exam   PCP: Lavera Guise, MD  Chief Complaint: No chief complaint on file.   History of Present Illness: Patient is a 25 y.o. No obstetric history on file. presents for annual exam. The patient has no complaints today.   LMP: No LMP recorded. Average Interval: regular monthly Duration of flow: 5 days Heavy Menses: no Clots: no Intermenstrual Bleeding: no Postcoital Bleeding: not applicable Dysmenorrhea: no  The patient is not currently sexually active. She currently uses abstinence for contraception. She denies dyspareunia.  The patient does perform self breast exams.  There is no notable family history of breast or ovarian cancer in her family.  The patient wears seatbelts: yes.   The patient has regular exercise: not asked.    The patient denies current symptoms of depression.    Review of Systems: Review of Systems  Constitutional:  Negative for chills and fever.  HENT:  Negative for congestion.   Respiratory:  Negative for cough and shortness of breath.   Cardiovascular:  Negative for chest pain and palpitations.  Gastrointestinal:  Negative for abdominal pain, constipation, diarrhea, heartburn, nausea and vomiting.  Genitourinary:  Negative for dysuria, frequency and urgency.  Skin:  Negative for itching and rash.  Neurological:  Negative for dizziness and headaches.  Endo/Heme/Allergies:  Negative for polydipsia.  Psychiatric/Behavioral:  Negative for depression.    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 Contraception: abstinence Last Pap: Results were: 09/14/2020 Colposcopy CIN I ectocervix negative ECC 07/27/2020 ASCUS with POSITIVE high risk HPV   Obstetric History: No obstetric history on file.  Family History:  Family History  Problem Relation Age of Onset   Hypertension Mother    Hypothyroidism Mother    Hypertension Father     Social History:  Social History   Socioeconomic History   Marital status: Single    Spouse name: Not on file   Number of children: Not on file   Years of education: Not on file   Highest education level: Not on file  Occupational History   Not on file  Tobacco Use   Smoking status: Never   Smokeless tobacco: Never  Vaping Use   Vaping Use: Never used  Substance and Sexual Activity   Alcohol use: Yes    Alcohol/week: 1.0 standard drink    Types: 1 Glasses of wine per week    Comment: occationally   Drug use: No   Sexual activity: Not Currently    Birth control/protection: None  Other Topics Concern   Not on file  Social History Narrative   Not on file   Social Determinants of Health   Financial Resource Strain: Not on file  Food Insecurity: Not on file  Transportation Needs: Not on file  Physical Activity: Not on file  Stress: Not on file  Social Connections: Not on file  Intimate Partner  Violence: Not on file    Allergies:  No Known Allergies  Medications: Prior to Admission medications   Medication Sig Start Date End Date Taking? Authorizing Provider  aspirin 81 MG EC tablet Take 81 mg by mouth daily. Swallow whole.    [provider]  HYDROcodone-acetaminophen (NORCO) 5-325 MG tablet Take 1-2 tablets by mouth every 6 (six) hours as needed for moderate pain or severe pain. 05/25/21   Schnier, Dolores Lory, MD  ibuprofen (ADVIL) 800 MG tablet Take 1 tablet (800 mg total) by mouth every 8  (eight) hours as needed. Patient taking differently: Take 800 mg by mouth every 8 (eight) hours as needed for cramping (menstrual pain relief). 01/11/21   Lavera Guise, MD  levothyroxine (SYNTHROID) 75 MCG tablet Take 1 tablet (75 mcg total) by mouth daily. Patient taking differently: Take 75 mcg by mouth daily before breakfast. 03/17/21   Lavera Guise, MD    Physical Exam Vitals: Blood pressure 128/86, pulse (!) 106, height 5\' 7"  (1.702 m), weight (!) 366 lb (166 kg), last menstrual period 09/09/2021.   General: NAD HEENT: normocephalic, anicteric Thyroid: no enlargement, no palpable nodules Pulmonary: No increased work of breathing, CTAB Cardiovascular: RRR, distal pulses 2+ Breast: Breast symmetrical, no tenderness, no palpable nodules or masses, no skin or nipple retraction present, no nipple discharge.  No axillary or supraclavicular lymphadenopathy. Abdomen: NABS, soft, non-tender, non-distended.  Umbilicus without lesions.  No hepatomegaly, splenomegaly or masses palpable. No evidence of hernia  Genitourinary:  External: Normal external female genitalia.  Normal urethral meatus, normal Bartholin's and Skene's glands.    Vagina: Normal vaginal mucosa, no evidence of prolapse.    Cervix: Grossly normal in appearance, no bleeding  Uterus: Non-enlarged, mobile, normal contour.  No CMT  Adnexa: ovaries non-enlarged, no adnexal masses  Rectal: deferred  Lymphatic: no evidence of inguinal lymphadenopathy Extremities: no edema, erythema, or tenderness Neurologic: Grossly intact Psychiatric: mood appropriate, affect full  Female chaperone present for pelvic and breast  portions of the physical exam    Assessment: 25 y.o. No obstetric history on file. routine annual exam  Plan: Problem List Items Addressed This Visit   None Visit Diagnoses     Encounter for gynecological examination without abnormal finding    -  Primary   Screening for malignant neoplasm of cervix        Relevant Orders   Cytology - PAP (Completed)       1) STI screening  was notoffered and therefore not obtained  2)  ASCCP guidelines and rational discussed.  Repeat today from ASCUS HPV positive pap last year with CIN I on colposcopy biopsy  3) Contraception - the patient is currently using  abstinence.  She is happy with her current form of contraception and plans to continue  4) Routine healthcare maintenance including cholesterol, diabetes screening discussed managed by PCP  5) Return in about 1 year (around 10/05/2022) for annual.   Malachy Mood, MD, [place], [facility] 10/05/2021, 2:24 PM

## 2021-10-10 ENCOUNTER — Encounter: Payer: Self-pay | Admitting: Obstetrics and Gynecology

## 2021-10-10 LAB — CYTOLOGY - PAP: Adequacy: ABSENT

## 2021-12-16 ENCOUNTER — Other Ambulatory Visit: Payer: Self-pay | Admitting: Surgery

## 2021-12-16 ENCOUNTER — Other Ambulatory Visit: Payer: Self-pay | Admitting: Urology

## 2021-12-16 DIAGNOSIS — S81802S Unspecified open wound, left lower leg, sequela: Secondary | ICD-10-CM

## 2021-12-21 ENCOUNTER — Other Ambulatory Visit: Payer: Self-pay

## 2021-12-21 ENCOUNTER — Ambulatory Visit
Admission: RE | Admit: 2021-12-21 | Discharge: 2021-12-21 | Disposition: A | Discharge status: Home / Self Care | Payer: 59 | Source: Ambulatory Visit | Attending: Surgery | Admitting: Surgery

## 2021-12-21 DIAGNOSIS — S81802S Unspecified open wound, left lower leg, sequela: Secondary | ICD-10-CM | POA: Insufficient documentation

## 2021-12-21 DIAGNOSIS — L03116 Cellulitis of left lower limb: Secondary | ICD-10-CM | POA: Insufficient documentation

## 2022-02-06 ENCOUNTER — Other Ambulatory Visit
Admission: RE | Admit: 2022-02-06 | Discharge: 2022-02-06 | Disposition: A | Discharge status: Home / Self Care | Payer: 59 | Source: Ambulatory Visit | Attending: Physician Assistant | Admitting: Physician Assistant

## 2022-02-06 ENCOUNTER — Encounter: Payer: 59 | Attending: Physician Assistant | Admitting: Physician Assistant

## 2022-02-06 DIAGNOSIS — Z6841 Body Mass Index (BMI) 40.0 and over, adult: Secondary | ICD-10-CM | POA: Diagnosis not present

## 2022-02-06 DIAGNOSIS — L97822 Non-pressure chronic ulcer of other part of left lower leg with fat layer exposed: Secondary | ICD-10-CM | POA: Insufficient documentation

## 2022-02-06 DIAGNOSIS — L089 Local infection of the skin and subcutaneous tissue, unspecified: Secondary | ICD-10-CM | POA: Diagnosis present

## 2022-02-06 NOTE — Progress Notes (Signed)
KENLEI, SAFI ([id number]) ?Visit Report for 02/06/2022 ?Chief Complaint Document Details ?Patient Name: Betty Fisher, Betty Fisher ?Date of Service: 02/06/2022 12:45 PM ?Medical Record Number: [id number] ?Patient Account Number: [id number] ?Date of Birth/Sex: 10/28/1996 (26 y.o. F) ?Treating RN: Levora Dredge ?Primary Care Provider: [name] Other Clinician: ?Referring Provider: Reine Just ?Treating Provider/Extender: [name] ?Weeks in Treatment: 0 ?Information Obtained from: Patient ?Chief Complaint ?Left LE Ulcer ?Electronic Signature(s) ?Signed: 02/06/2022 1:25:22 PM By: Worthy Keeler PA-C ?Entered By: Worthy Keeler on 02/06/2022 13:25:21 ?Betty Fisher, Betty Fisher ([id number]) ?-------------------------------------------------------------------------------- ?HPI Details ?Patient Name: Betty Fisher, Betty Fisher ?Date of Service: 02/06/2022 12:45 PM ?Medical Record Number: [id number] ?Patient Account Number: [id number] ?Date of Birth/Sex: [birth year] (26 y.o. F) ?Treating RN: Levora Dredge ?Primary Care Provider: [name] Other Clinician: ?Referring Provider: Reine Just ?Treating Provider/Extender: [name] ?Weeks in Treatment: 0 ?History of Present Illness ?HPI Description: 02-06-2022 upon evaluation today patient presents for initial inspection here in our clinic concerning an issue she has been ?having with her leg. This has been going on for quite a while. In fact her incision and drainage surgery was actually performed on September ?2022. Subsequently since that time she has been trying to get this to heal and it would heal sometimes scabbing over and seem to be doing okay ?and then it would reopen again and this is been back and forth. Her leg is also been significantly swollen intermittently today is not too bad there ?is some days when it is much much worse. Actually did review some of the pictures she submitted through epic and it showed a much more ?significantly swollen leg which actually I think may be  part of the reason why this is not wanting to heal as well as it should. Subsequently she is ?also having greenish drainage coming from the wound she tells me she is unsure why. She was on Bactrim for 2 to 3 weeks but has been off of ?that for several days at this point. She did have an ultrasound on December 23, 2021 due to concerns about this worsening and it was negative for ?any signs of abscess. ?The patient does seem to have a history of potential chronic venous insufficiency. Again as we discussed her weight may have some to do with ?this and losing weight could help but at the same time I am also concerned about the fact that she has had an injury here and it may just be ?some irritation in this leg causing more significant swelling than what would otherwise normally be expected. ?Electronic Signature(s) ?Signed: 02/06/2022 4:45:24 PM By: Worthy Keeler PA-C ?Entered By: Worthy Keeler on 02/06/2022 16:45:24 ?Betty Fisher, Betty Fisher ([id number]) ?-------------------------------------------------------------------------------- ?Physical Exam Details ?Patient Name: Betty Fisher, Betty Fisher ?Date of Service: 02/06/2022 12:45 PM ?Medical Record Number: [id number] ?Patient Account Number: [id number] ?Date of Birth/Sex: 1996-04-27 (26 y.o. F) ?Treating RN: Levora Dredge ?Primary Care Provider: [name] Other Clinician: ?Referring Provider: Reine Just ?Treating Provider/Extender: [name] ?Weeks in Treatment: 0 ?Constitutional ?patient is hypertensive.. pulse regular and within target range for patient.Marland Kitchen respirations regular, non-labored and within target range for patient.. ?temperature within target range for patient.. Well-nourished and well-hydrated in no acute distress. ?Eyes ?conjunctiva clear no eyelid edema noted. pupils equal round and reactive to light and accommodation. ?Ears, Nose, Mouth, and Throat ?no gross abnormality of ear auricles or external auditory canals. normal hearing noted during conversation.  mucus membranes moist. ?Respiratory ?normal breathing without difficulty. ?Cardiovascular ?2+ dorsalis pedis/posterior tibialis pulses. 2+ pitting edema of  the bilateral lower extremities. ?Musculoskeletal ?normal gait and posture. no significant deformity or arthritic changes, no loss or range of motion, no clubbing. ?Psychiatric ?this patient is able to make decisions and demonstrates good insight into disease process. Alert and Oriented x 3. pleasant and cooperative. ?Notes ?Upon inspection patient's wound actually appeared to be fairly clean and there was no need for even sharp debridement today which is good ?news. With that being said she does have a issue here with a open area that does continue to weep and drain. I do believe that a compression ?wrap will likely do really well for her and I discussed that with the patient today as well. She is in agreement with giving this a trial and I think ?that is going to be very beneficial for her. ?Electronic Signature(s) ?Signed: 02/06/2022 4:45:56 PM By: Worthy Keeler PA-C ?Entered By: Worthy Keeler on 02/06/2022 16:45:55 ?Betty Fisher, Betty Fisher ([id number]) ?-------------------------------------------------------------------------------- ?Physician Orders Details ?Patient Name: Betty Fisher, Betty Fisher ?Date of Service: 02/06/2022 12:45 PM ?Medical Record Number: [id number] ?Patient Account Number: [id number] ?Date of Birth/Sex: [birth year] (26 y.o. 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 ?o Nurse Visit  as needed ?Bathing/ Shower/ Hygiene ?o Wash wounds with antibacterial soap and water. ?o May shower with wound dressing protected with water repellent cover or cast protector. ?o No tub bath. ?Edema Control - Lymphedema / Segmental Compressive Device / Other ?o Elevate leg(s) parallel to the floor when sitting. ?o DO YOUR BEST to sleep in the bed at night. DO NOT sleep in your recliner. Long hours of sitting in a recliner leads to ?swelling of the legs and/or potential wounds on your backside. ?Wound Treatment ?Wound #1 - Lower Leg Wound Laterality: Left, Anterior ?Topical: Gentamicin 1 x Per Week/30 Days ?Discharge Instructions: Apply as directed by provider. ?Primary Dressing: Hydrofera Blue Ready Transfer Foam, 2.5x2.5 (in/in) 1 x Per Week/30 Days ?Discharge Instructions: Apply Hydrofera Blue Ready to wound bed as directed ?Secondary Dressing: (NON-BORDER) Zetuvit Plus Silicone NON-BORDER 5x5 (in/in) 1 x Per Week/30 Days ?Discharge Instructions: Please do not put silicone bordered dressings under wraps. Use non-bordered dressing only under ?wraps. ?Compression Wrap: 4-LAYER WRAP - Profore LF 4 Multi-Layer Compression Bandaging System 1 x Per Week/30 Days ?Discharge Instructions: Apply 4 multi-layer wrap as directed. ?Electronic Signature(s) ?Signed: 02/06/2022 4:36:08 PM By: Levora Dredge ?Signed: 02/06/2022 4:48:03 PM By: Worthy Keeler PA-C ?Entered By: Levora Dredge on 02/06/2022 13:55:58 ?Betty Fisher, Betty Fisher ([id number]) ?-------------------------------------------------------------------------------- ?Problem List Details ?Patient Name: Betty Fisher, Betty Fisher ?Date of Service: 02/06/2022 12:45 PM ?Medical Record Number: [id number] ?Patient Account Number: [id number] ?Date of Birth/Sex: 1995/12/02 (26 y.o. 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

## 2022-02-06 NOTE — Progress Notes (Signed)
ELIZ, NIGG ([id number]) ?Visit Report for 02/06/2022 ?Abuse Risk Screen Details ?Patient Name: Betty Fisher, Betty Fisher ?Date of Service: 02/06/2022 12:45 PM ?Medical Record Number: [id number] ?Patient Account Number: [id number] ?Date of Birth/Sex: [birth year] (25 y.o. F) ?Treating RN: Levora Dredge ?Primary Care Martie Muhlbauer: [name] Other Clinician: ?Referring Branko Steeves: Reine Just ?Treating Karinne Schmader/Extender: [name] ?Weeks in Treatment: 0 ?Abuse Risk Screen Items ?Answer ?ABUSE RISK SCREEN: ?Has anyone close to you tried to hurt or harm you recentlyo No ?Do you feel uncomfortable with anyone in your familyo No ?Has anyone forced you do things that you didnot want to doo No ?Electronic Signature(s) ?Signed: 02/06/2022 4:36:08 PM By: Levora Dredge ?Entered By: Levora Dredge on 02/06/2022 12:59:30 ?Betty Fisher, Betty Fisher ([id number]) ?-------------------------------------------------------------------------------- ?Activities of Daily Living Details ?Patient Name: Betty Fisher, Betty Fisher ?Date of Service: 02/06/2022 12:45 PM ?Medical Record Number: [id number] ?Patient Account Number: [id number] ?Date of Birth/Sex: 1996-06-06 (25 y.o. F) ?Treating RN: Levora Dredge ?Primary Care Cherrish Vitali: [name] Other Clinician: ?Referring Sienna Stonehocker: Reine Just ?Treating Rylyn Zawistowski/Extender: [name] ?Weeks in Treatment: 0 ?Activities of Daily Living Items ?Answer ?Activities of Daily Living (Please select one for each item) ?[place] ?Take Medications Completely Able ?Use Telephone Completely Able ?Care for Appearance Completely Able ?Use Toilet Completely Able ?Bath / Shower Completely Able ?Dress Self Completely Able ?Feed Self Completely Able ?Walk Completely Able ?Get In / Out Bed Completely Able ?Housework Completely Able ?Prepare Meals Completely Able ?Handle Money Completely Able ?Shop for Self Completely Able ?Electronic Signature(s) ?Signed: 02/06/2022 4:36:08 PM By: Levora Dredge ?Entered By:  Levora Dredge on 02/06/2022 12:59:54 ?Betty Fisher, Betty Fisher ([id number]) ?-------------------------------------------------------------------------------- ?Education Screening Details ?Patient Name: Betty Fisher, Betty Fisher ?Date of Service: 02/06/2022 12:45 PM ?Medical Record Number: [id number] ?Patient Account Number: [id number] ?Date of Birth/Sex: 12-26-1995 (25 y.o. F) ?Treating RN: Levora Dredge ?Primary Care Yassin Scales: [name] Other Clinician: ?Referring Seema Blum: Reine Just ?Treating Shigeo Baugh/Extender: [name] ?Weeks in Treatment: 0 ?Learning Preferences/Education Level/Primary Language ?Learning Preference: Explanation, Demonstration, Video, Communication Board, Printed Material ?Preferred Language: English ?Cognitive Barrier ?Language Barrier: No ?Translator Needed: No ?Memory Deficit: No ?Emotional Barrier: No ?Cultural/Religious Beliefs Affecting Medical Care: No ?Physical Barrier ?Impaired Vision: Yes Glasses ?Impaired Hearing: No ?Decreased Hand dexterity: No ?Knowledge/Comprehension ?Knowledge Level: High ?Comprehension Level: High ?Ability to understand written instructions: High ?Ability to understand verbal instructions: High ?Motivation ?Anxiety Level: Calm ?Cooperation: Cooperative ?Education Importance: Acknowledges Need ?Interest in Health Problems: Asks Questions ?Perception: Coherent ?Willingness to Engage in Self-Management ?High ?Activities: ?Readiness to Engage in Self-Management ?High ?Activities: ?Electronic Signature(s) ?Signed: 02/06/2022 4:36:08 PM By: Levora Dredge ?Entered By: Levora Dredge on 02/06/2022 13:00:26 ?Betty Fisher, Betty Fisher ([id number]) ?-------------------------------------------------------------------------------- ?Fall Risk Assessment Details ?Patient Name: Betty Fisher, Betty Fisher ?Date of Service: 02/06/2022 12:45 PM ?Medical Record Number: [id number] ?Patient Account Number: [id number] ?Date of Birth/Sex: 1996-03-11 (25 y.o. F) ?Treating RN: Levora Dredge ?Primary Care  Averey Trompeter: [name] Other Clinician: ?Referring Leanna Hamid: Reine Just ?Treating Leanthony Rhett/Extender: [name] ?Weeks in Treatment: 0 ?Fall Risk Assessment Items ?Have you had 2 or more falls in the last 12 monthso 0 No ?Have you had any fall that resulted in injury in the last 12 monthso 0 No ?FALLS RISK SCREEN ?History of falling - immediate or within 3 months 0 No ?Secondary diagnosis (Do you have 2 or more medical diagnoseso) 0 No ?Ambulatory aid ?None/bed rest/wheelchair/nurse 0 Yes ?Crutches/cane/walker 0 No ?Furniture 0 No ?Intravenous therapy Access/Saline/Heparin Lock 0 No ?Gait/Transferring ?Normal/ bed rest/ wheelchair 0 Yes ?Weak (short steps with or without shuffle, stooped but able  to lift head while walking, may ?0 No ?seek support from furniture) ?Impaired (short steps with shuffle, may have difficulty arising from chair, head down, impaired ?0 No ?balance) ?Mental Status ?Oriented to own ability 0 Yes ?Electronic Signature(s) ?Signed: 02/06/2022 4:36:08 PM By: Levora Dredge ?Entered By: Levora Dredge on 02/06/2022 13:00:40 ?Betty Fisher, Betty Fisher ([id number]) ?-------------------------------------------------------------------------------- ?Foot Assessment Details ?Patient Name: Betty Fisher, Betty Fisher ?Date of Service: 02/06/2022 12:45 PM ?Medical Record Number: [id number] ?Patient Account Number: [id number] ?Date of Birth/Sex: 10-Dec-1995 (25 y.o. F) ?Treating RN: Levora Dredge ?Primary Care Tamrah Victorino: [name] Other Clinician: ?Referring Leanna Hamid: Reine Just ?Treating Harbor Paster/Extender: [name] ?Weeks in Treatment: 0 ?Foot Assessment Items ?Site Locations ?+ = Sensation present, - = Sensation absent, C = Callus, U = Ulcer ?R = Redness, W = Warmth, M = Maceration, PU = Pre-ulcerative lesion ?F = Fissure, S = Swelling, D = Dryness ?Assessment ?Right: Left: ?Other Deformity: No No ?Prior Foot Ulcer: No No ?Prior Amputation: No No ?Charcot Joint: No No ?Ambulatory Status: Ambulatory Without  Help ?Gait: Steady ?Electronic Signature(s) ?Signed: 02/06/2022 4:36:08 PM By: Levora Dredge ?Entered By: Levora Dredge on 02/06/2022 13:07:50 ?Betty Fisher, Betty Fisher ([id number]) ?-------------------------------------------------------------------------------- ?Nutrition Risk Screening Details ?Patient Name: Betty Fisher, Betty Fisher ?Date of Service: 02/06/2022 12:45 PM ?Medical Record Number: [id number] ?Patient Account Number: [id number] ?Date of Birth/Sex: 01-27-1996 (25 y.o. F) ?Treating RN: Levora Dredge ?Primary Care Tyshea Imel: [name] Other Clinician: ?Referring Marium Ragan: Reine Just ?Treating Henritta Mutz/Extender: [name] ?Weeks in Treatment: 0 ?Height (in): 67 ?Weight (lbs): 363 ?Body Mass Index (BMI): 56.8 ?Nutrition Risk Screening Items ?Score Screening ?NUTRITION RISK SCREEN: ?I have an illness or condition that made me change the kind and/or amount of food I eat 0 No ?I eat fewer than two meals per day 0 No ?I eat few fruits and vegetables, or milk products 0 No ?I have three or more drinks of beer, liquor or wine almost every day 0 No ?I have tooth or mouth problems that make it hard for me to eat 0 No ?I don't always have enough money to buy the food I need 0 No ?I eat alone most of the time 0 No ?I take three or more different prescribed or over-the-counter drugs a day 0 No ?Without wanting to, I have lost or gained 10 pounds in the last six months 0 No ?I am not always physically able to shop, cook and/or feed myself 0 No ?Nutrition Protocols ?Good Risk Protocol 0 No interventions needed ?Moderate Risk Protocol ?High Risk Proctocol ?Risk Level: Good Risk ?Score: 0 ?Electronic Signature(s) ?Signed: 02/06/2022 4:36:08 PM By: Levora Dredge ?Entered By: Levora Dredge on 02/06/2022 13:01:45 ?

## 2022-02-06 NOTE — Progress Notes (Signed)
SHERBY, MONCAYO ([id number]) ?Visit Report for 02/06/2022 ?Allergy List Details ?Patient Name: Betty Fisher, Betty Fisher ?Date of Service: 02/06/2022 12:45 PM ?Medical Record Number: [id number] ?Patient Account Number: [id number] ?Date of Birth/Sex: 1996-06-04 (26 y.o. F) ?Treating RN: Levora Dredge ?Primary Care Nataliya Graig: [name] Other Clinician: ?Referring Oceana Walthall: Reine Just ?Treating Trixy Loyola/Extender: [name] ?Weeks in Treatment: 0 ?Allergies ?Active Allergies ?No Known Drug Allergies ?Allergy Notes ?Electronic Signature(s) ?Signed: 02/06/2022 4:36:08 PM By: Levora Dredge ?Entered By: Levora Dredge on 02/06/2022 12:57:16 ?ATIA, HAUPT ([id number]) ?-------------------------------------------------------------------------------- ?Arrival Information Details ?Patient Name: Betty, Fisher ?Date of Service: 02/06/2022 12:45 PM ?Medical Record Number: [id number] ?Patient Account Number: [id number] ?Date of Birth/Sex: 1996/07/27 (26 y.o. F) ?Treating RN: Levora Dredge ?Primary Care Paullette Mckain: [name] Other Clinician: ?Referring Neymar Dowe: Reine Just ?Treating Jamieson Lisa/Extender: [name] ?Weeks in Treatment: 0 ?Visit Information ?Patient Arrived: Ambulatory ?Arrival Time: 12:49 ?Accompanied By: self ?Transfer Assistance: None ?Patient Identification Verified: Yes ?Secondary Verification Process Completed: Yes ?Patient Has Alerts: Yes ?Patient Alerts: Patient on Blood Thinner ?Electronic Signature(s) ?Signed: 02/06/2022 4:35:32 PM By: Levora Dredge ?Entered By: Levora Dredge on 02/06/2022 16:35:31 ?KIANDRA, SANGUINETTI ([id number]) ?-------------------------------------------------------------------------------- ?Clinic Level of Care Assessment Details ?Patient Name: Betty, Fisher ?Date of Service: 02/06/2022 12:45 PM ?Medical Record Number: [id number] ?Patient Account Number: [id number] ?Date of Birth/Sex: [birth year] (26 y.o. F) ?Treating RN: Levora Dredge ?Primary Care Crystie Yanko: [name]  Other Clinician: ?Referring Analysse Quinonez: Reine Just ?Treating Tanith Dagostino/Extender: [name] ?Weeks in Treatment: 0 ?Clinic Level of Care Assessment Items ?TOOL 1 Quantity Score ?'[]'$  - Use when EandM and Procedure is performed on INITIAL visit 0 ?ASSESSMENTS - Nursing Assessment / Reassessment ?X - General Physical Exam (combine w/ comprehensive assessment (listed just below) when performed on new ?1 20 ?pt. evals) ?X- 1 25 ?Comprehensive Assessment (HX, ROS, Risk Assessments, Wounds Hx, etc.) ?ASSESSMENTS - Wound and Skin Assessment / Reassessment ?'[]'$  - Dermatologic / Skin Assessment (not related to wound area) 0 ?ASSESSMENTS - Ostomy and/or Continence Assessment and Care ?'[]'$  - Incontinence Assessment and Management 0 ?'[]'$  - 0 ?Ostomy Care Assessment and Management (repouching, etc.) ?PROCESS - Coordination of Care ?X - Simple Patient / Family Education for ongoing care 1 15 ?'[]'$  - 0 ?Complex (extensive) Patient / Family Education for ongoing care ?X- 1 10 ?Staff obtains Consents, Records, Test Results / Process Orders ?'[]'$  - 0 ?Staff telephones HHA, Nursing Homes / Clarify orders / etc ?'[]'$  - 0 ?Routine Transfer to another Facility (non-emergent condition) ?'[]'$  - 0 ?Routine Hospital Admission (non-emergent condition) ?X- 1 15 ?New Admissions / Biomedical engineer / Ordering NPWT, Apligraf, etc. ?'[]'$  - 0 ?Emergency Hospital Admission (emergent condition) ?PROCESS - Special Needs ?'[]'$  - Pediatric / Minor Patient Management 0 ?'[]'$  - 0 ?Isolation Patient Management ?'[]'$  - 0 ?Hearing / Language / Visual special needs ?'[]'$  - 0 ?Assessment of Community assistance (transportation, D/C planning, etc.) ?'[]'$  - 0 ?Additional assistance / Altered mentation ?'[]'$  - 0 ?Support Surface(s) Assessment (bed, cushion, seat, etc.) ?INTERVENTIONS - Miscellaneous ?'[]'$  - External ear exam 0 ?'[]'$  - 0 ?Patient Transfer (multiple staff / Civil Service fast streamer / Similar devices) ?'[]'$  - 0 ?Simple Staple / Suture removal (25 or less) ?'[]'$  - 0 ?Complex Staple /  Suture removal (26 or more) ?'[]'$  - 0 ?Hypo/Hyperglycemic Management (do not check if billed separately) ?X- 1 15 ?Ankle / Brachial Index (ABI) - do not check if billed separately ?Has the patient been seen at the hospital within the last three years: Yes ?Total Score: 100 ?Level Of Care: New/Established -  Level ?3 ?MARILYN, NIHISER ([id number]) ?Electronic Signature(s) ?Signed: 02/06/2022 4:36:08 PM By: Levora Dredge ?Entered By: Levora Dredge on 02/06/2022 13:57:07 ?KORYNN, KENEDY ([id number]) ?-------------------------------------------------------------------------------- ?Compression Therapy Details ?Patient Name: Betty, Fisher ?Date of Service: 02/06/2022 12:45 PM ?Medical Record Number: [id number] ?Patient Account Number: [id number] ?Date of Birth/Sex: 19-Nov-1995 (26 y.o. F) ?Treating RN: Levora Dredge ?Primary Care Zamyia Gowell: [name] Other Clinician: ?Referring Analiya Porco: Reine Just ?Treating Geanine Vandekamp/Extender: [name] ?Weeks in Treatment: 0 ?Compression Therapy Performed for Wound Assessment: Wound #1 Left,Anterior Lower Leg ?Performed By: Clinician Levora Dredge, RN ?Compression Type: Four Layer ?Pre Treatment ABI: 1 ?Post Procedure Diagnosis ?Same as Pre-procedure ?Notes ?pt tolerated wrap with issue ?Electronic Signature(s) ?Signed: 02/06/2022 4:36:08 PM By: Levora Dredge ?Entered By: Levora Dredge on 02/06/2022 13:56:33 ?CAPRICIA, SERDA ([id number]) ?-------------------------------------------------------------------------------- ?Encounter Discharge Information Details ?Patient Name: Betty, Fisher ?Date of Service: 02/06/2022 12:45 PM ?Medical Record Number: [id number] ?Patient Account Number: [id number] ?Date of Birth/Sex: May 17, 1996 (26 y.o. F) ?Treating RN: Levora Dredge ?Primary Care Karley Pho: [name] Other Clinician: ?Referring Laddie Naeem: Reine Just ?Treating Murphy Bundick/Extender: [name] ?Weeks in Treatment: 0 ?Encounter Discharge Information Items ?Discharge  Condition: Stable ?Ambulatory Status: Ambulatory ?Discharge Destination: Home ?Transportation: Private Auto ?Accompanied By: self ?Schedule Follow-up Appointment: Yes ?Clinical Summary of Care: Patient Declined ?Electronic Signature(s) ?Signed: 02/06/2022 4:36:08 PM By: Levora Dredge ?Entered By: Levora Dredge on 02/06/2022 13:58:19 ?TAMBRIA, PFANNENSTIEL ([id number]) ?-------------------------------------------------------------------------------- ?Lower Extremity Assessment Details ?Patient Name: GLENDON, FISER ?Date of Service: 02/06/2022 12:45 PM ?Medical Record Number: [id number] ?Patient Account Number: [id number] ?Date of Birth/Sex: November 11, 1995 (26 y.o. F) ?Treating RN: Levora Dredge ?Primary Care Oron Westrup: [name] Other Clinician: ?Referring Kimiye Strathman: Reine Just ?Treating Javel Hersh/Extender: [name] ?Weeks in Treatment: 0 ?Edema Assessment ?Assessed: [Left: No] [Right: No] ?Edema: [Left: Ye] [Right: s] ?Calf ?Left: Right: ?Point of Measurement: 30 cm From Medial Instep 53.7 cm ?Ankle ?Left: Right: ?Point of Measurement: 9 cm From Medial Instep 30 cm ?Vascular Assessment ?Pulses: ?Dorsalis Pedis ?Palpable: [Left:Yes] ?Blood Pressure: ?Brachial: [Left:144] ?Dorsalis Pedis: 130 ?Ankle: ?Posterior Tibial: 140 ?Ankle Brachial Index: [Left:0.97] ?Electronic Signature(s) ?Signed: 02/06/2022 4:36:08 PM By: Levora Dredge ?Entered By: Levora Dredge on 02/06/2022 13:27:33 ?ADALYNN, CORNE ([id number]) ?-------------------------------------------------------------------------------- ?Multi Wound Chart Details ?Patient Name: NEIDRA, GIRVAN ?Date of Service: 02/06/2022 12:45 PM ?Medical Record Number: [id number] ?Patient Account Number: [id number] ?Date of Birth/Sex: 1996-09-24 (26 y.o. F) ?Treating RN: Levora Dredge ?Primary Care Daesha Insco: [name] Other Clinician: ?Referring Amond Speranza: Reine Just ?Treating Sue Fernicola/Extender: [name] ?Weeks in Treatment: 0 ?Vital Signs ?Height(in):  67 ?Pulse(bpm): 102 ?Weight(lbs): 363 ?Blood Pressure(mmHg): 144/105 ?Body Mass Index(BMI): 56.8 ?Temperature(??F): 97.7 ?Respiratory Rate(breaths/min): 18 ?Photos: [N/A:N/A] ?Wound Location: Left, Anterior Lower Leg N

## 2022-02-07 DIAGNOSIS — L97822 Non-pressure chronic ulcer of other part of left lower leg with fat layer exposed: Secondary | ICD-10-CM | POA: Diagnosis not present

## 2022-02-07 NOTE — Progress Notes (Signed)
TEONDRA, [place]BUR[place] ([id number]) ?Visit Report for 02/07/2022 ?Physician Orders Details ?Patient Name: Betty Fisher ?Date of Service: 02/07/2022 3:45 PM ?Medical Record Number: [id number] ?Patient Account Number: [id number] ?Date of Birth/Sex: 10/22/1996 (25 y.o. F) ?Treating RN: Levora Dredge ?Primary Care Provider: [name] Other Clinician: ?Referring Provider: [name] ?Treating Provider/Extender: [name] ?Weeks in Treatment: 0 ?Verbal / Phone Orders: No ?Diagnosis Coding ?Follow-up Appointments ?o Return Appointment in 1 week. ?o Nurse Visit as needed ?Bathing/ Shower/ Hygiene ?o Wash wounds with antibacterial soap and water. ?o May shower with wound dressing protected with water repellent cover or cast protector. ?o No tub bath. ?Edema Control - Lymphedema / Segmental Compressive Device / Other ?o Optional: One layer of unna paste to top of compression wrap (to act as an anchor). ?o Elevate leg(s) parallel to the floor when sitting. ?o DO YOUR BEST to sleep in the bed at night. DO NOT sleep in your recliner. Long hours of sitting in a recliner leads to ?swelling of the legs and/or potential wounds on your backside. ?Wound Treatment ?Wound #1 - Lower Leg Wound Laterality: Left, Anterior ?Topical: Gentamicin 1 x Per Week/30 Days ?Discharge Instructions: Apply as directed by provider. ?Primary Dressing: Hydrofera Blue Ready Transfer Foam, 2.5x2.5 (in/in) 1 x Per Week/30 Days ?Discharge Instructions: Apply Hydrofera Blue Ready to wound bed as directed ?Secondary Dressing: (NON-BORDER) Zetuvit Plus Silicone NON-BORDER 5x5 (in/in) 1 x Per Week/30 Days ?Discharge Instructions: Please do not put silicone bordered dressings under wraps. Use non-bordered dressing only under ?wraps. ?Compression Wrap: 4-LAYER WRAP - Profore LF 4 Multi-Layer Compression Bandaging System 1 x Per Week/30 Days ?Discharge Instructions: Apply 4 multi-layer wrap as directed. ?Electronic Signature(s) ?Signed: 02/07/2022  4:51:41 PM By: Levora Dredge ?Signed: 02/07/2022 5:16:21 PM By: Worthy Keeler PA-C ?Entered By: Levora Dredge on 02/07/2022 16:24:17 ?Betty Fisher, Betty Fisher ([id number]) ?-------------------------------------------------------------------------------- ?SuperBill Details ?Patient Name: Betty Fisher, Betty Fisher ?Date of Service: 02/07/2022 ?Medical Record Number: [id number] ?Patient Account Number: [id number] ?Date of Birth/Sex: [birth year] (25 y.o. 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

## 2022-02-07 NOTE — Progress Notes (Signed)
CAM, HARNDEN ([id number]) ?Visit Report for 02/07/2022 ?Arrival Information Details ?Patient Name: Betty Fisher ?Date of Service: 02/07/2022 3:45 PM ?Medical Record Number: [id number] ?Patient Account Number: [id number] ?Date of Birth/Sex: [birth year] (25 y.o. F) ?Treating RN: Levora Dredge ?Primary Care Shakesha Soltau: [name] Other Clinician: ?Referring Priscilla Kirstein: [name] ?Treating Shabre Kreher/Extender: [name] ?Weeks in Treatment: 0 ?Visit Information History Since Last Visit ?Added or deleted any medications: No ?Patient Arrived: Ambulatory ?Any new allergies or adverse reactions: No ?Arrival Time: 15:59 ?Had a fall or experienced change in No ?Accompanied By: self ?activities of daily living that may affect ?Transfer Assistance: None ?risk of falls: ?Patient Has Alerts: Yes ?Hospitalized since last visit: No ?Patient Alerts: Patient on Blood Thinner ?Has Dressing in Place as Prescribed: Yes ?Has Compression in Place as Prescribed: Yes ?Pain Present Now: Yes ?Electronic Signature(s) ?Signed: 02/07/2022 4:51:41 PM By: Levora Dredge ?Entered By: Levora Dredge on 02/07/2022 16:17:03 ?Betty Fisher, Betty Fisher ([id number]) ?-------------------------------------------------------------------------------- ?Clinic Level of Care Assessment Details ?Patient Name: Betty Fisher ?Date of Service: 02/07/2022 3:45 PM ?Medical Record Number: [id number] ?Patient Account Number: [id number] ?Date of Birth/Sex: 1996/03/10 (25 y.o. F) ?Treating RN: Levora Dredge ?Primary Care Stephaney Steven: [name] Other Clinician: ?Referring Adonias Demore: [name] ?Treating Guillermina Shaft/Extender: [name] ?Weeks in Treatment: 0 ?Clinic Level of Care Assessment Items ?TOOL 1 Quantity Score ?'[]'$  - Use when EandM and Procedure is performed on INITIAL visit 0 ?ASSESSMENTS - Nursing Assessment / Reassessment ?'[]'$  - General Physical Exam (combine w/ comprehensive assessment (listed just below) when performed on new ?0 ?pt. evals) ?'[]'$  - 0 ?Comprehensive  Assessment (HX, ROS, Risk Assessments, Wounds Hx, etc.) ?ASSESSMENTS - Wound and Skin Assessment / Reassessment ?'[]'$  - Dermatologic / Skin Assessment (not related to wound area) 0 ?ASSESSMENTS - Ostomy and/or Continence Assessment and Care ?'[]'$  - Incontinence Assessment and Management 0 ?'[]'$  - 0 ?Ostomy Care Assessment and Management (repouching, etc.) ?PROCESS - Coordination of Care ?'[]'$  - Simple Patient / Family Education for ongoing care 0 ?'[]'$  - 0 ?Complex (extensive) Patient / Family Education for ongoing care ?'[]'$  - 0 ?Staff obtains Consents, Records, Test Results / Process Orders ?'[]'$  - 0 ?Staff telephones HHA, Nursing Homes / Clarify orders / etc ?'[]'$  - 0 ?Routine Transfer to another Facility (non-emergent condition) ?'[]'$  - 0 ?Routine Hospital Admission (non-emergent condition) ?'[]'$  - 0 ?New Admissions / Biomedical engineer / Ordering NPWT, Apligraf, etc. ?'[]'$  - 0 ?Emergency Hospital Admission (emergent condition) ?PROCESS - Special Needs ?'[]'$  - Pediatric / Minor Patient Management 0 ?'[]'$  - 0 ?Isolation Patient Management ?'[]'$  - 0 ?Hearing / Language / Visual special needs ?'[]'$  - 0 ?Assessment of Community assistance (transportation, D/C planning, etc.) ?'[]'$  - 0 ?Additional assistance / Altered mentation ?'[]'$  - 0 ?Support Surface(s) Assessment (bed, cushion, seat, etc.) ?INTERVENTIONS - Miscellaneous ?'[]'$  - External ear exam 0 ?'[]'$  - 0 ?Patient Transfer (multiple staff / Civil Service fast streamer / Similar devices) ?'[]'$  - 0 ?Simple Staple / Suture removal (25 or less) ?'[]'$  - 0 ?Complex Staple / Suture removal (26 or more) ?'[]'$  - 0 ?Hypo/Hyperglycemic Management (do not check if billed separately) ?'[]'$  - 0 ?Ankle / Brachial Index (ABI) - do not check if billed separately ?Has the patient been seen at the hospital within the last three years: Yes ?Total Score: 0 ?Level Of Care: ____ ?Betty Fisher, Betty Fisher ([id number]) ?Electronic Signature(s) ?Signed: 02/07/2022 4:51:41 PM By: Levora Dredge ?Entered By: Levora Dredge on 02/07/2022  16:21:00 ?Betty Fisher, Betty Fisher ([id number]) ?-------------------------------------------------------------------------------- ?Compression Therapy Details ?Patient Name: Betty Fisher, Betty Fisher. ?Date of  Service: 02/07/2022 3:45 PM ?Medical Record Number: [id number] ?Patient Account Number: [id number] ?Date of Birth/Sex: 1996-05-04 (25 y.o. F) ?Treating RN: Levora Dredge ?Primary Care Keishana Klinger: [name] Other Clinician: ?Referring Raedyn Klinck: [name] ?Treating Mayar Whittier/Extender: [name] ?Weeks in Treatment: 0 ?Compression Therapy Performed for Wound Assessment: Wound #1 Left,Anterior Lower Leg ?Performed By: Clinician Levora Dredge, RN ?Compression Type: Four Layer ?Electronic Signature(s) ?Signed: 02/07/2022 4:51:41 PM By: Levora Dredge ?Entered By: Levora Dredge on 02/07/2022 16:17:53 ?Betty Fisher, Betty Fisher ([id number]) ?-------------------------------------------------------------------------------- ?Encounter Discharge Information Details ?Patient Name: Betty Fisher ?Date of Service: 02/07/2022 3:45 PM ?Medical Record Number: [id number] ?Patient Account Number: [id number] ?Date of Birth/Sex: 10-17-1996 (25 y.o. F) ?Treating RN: Levora Dredge ?Primary Care Magaret Justo: [name] Other Clinician: ?Referring Cathey Fredenburg: [name] ?Treating Hesper Venturella/Extender: [name] ?Weeks in Treatment: 0 ?Encounter Discharge Information Items ?Discharge Condition: Stable ?Ambulatory Status: Ambulatory ?Discharge Destination: Home ?Transportation: Private Auto ?Accompanied By: self ?Schedule Follow-up Appointment: Yes ?Clinical Summary of Care: Patient Declined ?Electronic Signature(s) ?Signed: 02/07/2022 4:51:41 PM By: Levora Dredge ?Entered By: Levora Dredge on 02/07/2022 16:20:50 ?Betty Fisher, Betty Fisher ([id number]) ?-------------------------------------------------------------------------------- ?Wound Assessment Details ?Patient Name: Betty Fisher ?Date of Service: 02/07/2022 3:45 PM ?Medical Record Number: [id number] ?Patient  Account Number: [id number] ?Date of Birth/Sex: [birth year] (25 y.o. F) ?Treating RN: Levora Dredge ?Primary Care Orra Nolde: [name] Other Clinician: ?Referring Kripa Foskey: [name] ?Treating Kolbe Delmonaco/Extender: [name] ?Weeks in Treatment: 0 ?Wound Status ?Wound Number: 1 ?Primary Etiology: Open Surgical Wound ?Wound Location: Left, Anterior Lower Leg ?Wound Status: Open ?Wounding Event: Surgical Injury ?Date Acquired: 01/29/2020 ?Weeks Of Treatment: 0 ?Clustered Wound: No ?Wound Measurements ?Length: (cm) 1.9 ?Width: (cm) 0.7 ?Depth: (cm) 0.2 ?Area: (cm?) 1.045 ?Volume: (cm?) 0.209 ?% Reduction in Area: 0% ?% Reduction in Volume: 0% ?Epithelialization: Small (1-33%) ?Tunneling: No ?Undermining: No ?Wound Description ?Classification: Full Thickness Without Exposed Support Structu ?Exudate Amount: Medium ?Exudate Type: Serosanguineous ?Exudate Color: red, brown ?res Foul Odor After Cleansing: No ?Slough/Fibrino Yes ?Wound Bed ?Granulation Amount: Medium (34-66%) Exposed Structure ?Granulation Quality: Pink, Pale ?Fat Layer (Subcutaneous Tissue) Exposed: Yes ?Necrotic Amount: Medium (34-66%) ?Necrotic Quality: Adherent Slough ?Treatment Notes ?Wound #1 (Lower Leg) Wound Laterality: Left, Anterior ?Cleanser ?Peri-Wound Care ?Topical ?Gentamicin ?Discharge Instruction: Apply as directed by Chemika Nightengale. ?Primary Dressing ?Hydrofera Blue Ready Transfer Foam, 2.5x2.5 (in/in) ?Discharge Instruction: Apply Hydrofera Blue Ready to wound bed as directed ?Secondary Dressing ?(NON-BORDER) Zetuvit Plus Silicone NON-BORDER 5x5 (in/in) ?Discharge Instruction: Please do not put silicone bordered dressings under wraps. Use non-bordered dressing only under wraps. ?Secured With ?Compression Wrap ?4-LAYER WRAP - Profore LF 4 Multi-Layer Compression Bandaging System ?Discharge Instruction: Apply 4 multi-layer wrap as directed. ?Compression Stockings ?Betty Fisher, Betty Fisher ([id number]) ?Add-Ons ?Electronic Signature(s) ?Signed: 02/07/2022  4:51:41 PM By: Levora Dredge ?Entered By: Levora Dredge on 02/07/2022 16:17:27 ?

## 2022-02-09 LAB — AEROBIC CULTURE W GRAM STAIN (SUPERFICIAL SPECIMEN): Gram Stain: NONE SEEN

## 2022-02-10 DIAGNOSIS — L97822 Non-pressure chronic ulcer of other part of left lower leg with fat layer exposed: Secondary | ICD-10-CM | POA: Diagnosis not present

## 2022-02-13 NOTE — Progress Notes (Signed)
ARTIA, SINGLEY ([id number]) ?Visit Report for 02/10/2022 ?Arrival Information Details ?Patient Name: Betty Fisher, Betty Fisher ?Date of Service: 02/10/2022 4:00 PM ?Medical Record Number: [id number] ?Patient Account Number: [id number] ?Date of Birth/Sex: June 09, 1996 (26 y.o. F) ?Treating RN: Cornell Barman ?Primary Care Ketra Duchesne: [name] Other Clinician: ?Referring Syndi Pua: [name] ?Treating Dorthula Bier/Extender: [name] ?Weeks in Treatment: 0 ?Visit Information History Since Last Visit ?Has Dressing in Place as Prescribed: Yes ?Patient Arrived: Ambulatory ?Has Compression in Place as Prescribed: Yes ?Arrival Time: 10:14 ?Pain Present Now: No ?Accompanied By: self ?Transfer Assistance: None ?Patient Identification Verified: Yes ?Secondary Verification Process Completed: Yes ?Patient Has Alerts: Yes ?Patient Alerts: Patient on Blood Thinner ?Electronic Signature(s) ?Signed: 02/13/2022 10:14:21 AM By: Gretta Cool, BSN, RN, CWS, Kim RN, BSN ?Entered By: Gretta Cool, BSN, RN, CWS, Kim on 02/13/2022 10:14:21 ?Betty Fisher, Betty Fisher ([id number]) ?-------------------------------------------------------------------------------- ?Compression Therapy Details ?Patient Name: Betty Fisher, Betty Fisher ?Date of Service: 02/10/2022 4:00 PM ?Medical Record Number: [id number] ?Patient Account Number: [id number] ?Date of Birth/Sex: Feb 12, 1996 (26 y.o. F) ?Treating RN: Cornell Barman ?Primary Care Aedon Deason: [name] Other Clinician: ?Referring Orvil Faraone: [name] ?Treating Shawni Volkov/Extender: [name] ?Weeks in Treatment: 0 ?Compression Therapy Performed for Wound Assessment: Wound #1 Left,Anterior Lower Leg ?Performed By: Clinician Cornell Barman, RN ?Compression Type: Four Layer ?Pre Treatment ABI: 1 ?Electronic Signature(s) ?Signed: 02/13/2022 10:16:12 AM By: Gretta Cool, BSN, RN, CWS, Kim RN, BSN ?Entered By: Gretta Cool, BSN, RN, CWS, Kim on 02/13/2022 10:16:12 ?Betty Fisher, Betty Fisher ([id number]) ?-------------------------------------------------------------------------------- ?Encounter  Discharge Information Details ?Patient Name: Betty Fisher, Betty Fisher ?Date of Service: 02/10/2022 4:00 PM ?Medical Record Number: [id number] ?Patient Account Number: [id number] ?Date of Birth/Sex: 1995-11-29 (26 y.o. F) ?Treating RN: Cornell Barman ?Primary Care Zameria Vogl: [name] Other Clinician: ?Referring Dorine Duffey: [name] ?Treating Jackee Glasner/Extender: [name] ?Weeks in Treatment: 0 ?Encounter Discharge Information Items ?Discharge Condition: Stable ?Ambulatory Status: Ambulatory ?Discharge Destination: Home ?Transportation: Private Auto ?Schedule Follow-up Appointment: Yes ?Clinical Summary of Care: ?Electronic Signature(s) ?Signed: 02/13/2022 10:16:48 AM By: Gretta Cool, BSN, RN, CWS, Kim RN, BSN ?Entered By: Gretta Cool, BSN, RN, CWS, Kim on 02/13/2022 10:16:47 ?Betty Fisher, Betty Fisher ([id number]) ?-------------------------------------------------------------------------------- ?Wound Assessment Details ?Patient Name: Betty Fisher, Betty Fisher ?Date of Service: 02/10/2022 4:00 PM ?Medical Record Number: [id number] ?Patient Account Number: [id number] ?Date of Birth/Sex: 30-Nov-1995 (26 y.o. F) ?Treating RN: Cornell Barman ?Primary Care Conchita Truxillo: [name] Other Clinician: ?Referring Orvile Corona: [name] ?Treating Reginald Mangels/Extender: [name] ?Weeks in Treatment: 0 ?Wound Status ?Wound Number: 1 ?Primary Etiology: Open Surgical Wound ?Wound Location: Left, Anterior Lower Leg ?Wound Status: Open ?Wounding Event: Surgical Injury ?Date Acquired: 01/29/2020 ?Weeks Of Treatment: 0 ?Clustered Wound: No ?Wound Measurements ?Length: (cm) 1.9 ?Width: (cm) 0.7 ?Depth: (cm) 0.2 ?Area: (cm?) 1.045 ?Volume: (cm?) 0.209 ?% Reduction in Area: 0% ?% Reduction in Volume: 0% ?Epithelialization: Small (1-33%) ?Wound Description ?Classification: Full Thickness Without Exposed Support Structu ?Exudate Amount: Medium ?Exudate Type: Serosanguineous ?Exudate Color: red, brown ?res Foul Odor After Cleansing: No ?Slough/Fibrino Yes ?Wound Bed ?Granulation Amount: Medium  (34-66%) Exposed Structure ?Granulation Quality: Pink, Pale ?Fat Layer (Subcutaneous Tissue) Exposed: Yes ?Necrotic Amount: Medium (34-66%) ?Necrotic Quality: Adherent Slough ?Assessment Notes ?Late entry ?Treatment Notes ?Wound #1 (Lower Leg) Wound Laterality: Left, Anterior ?Cleanser ?Peri-Wound Care ?Topical ?Gentamicin ?Discharge Instruction: Apply as directed by Nadav Swindell. ?Primary Dressing ?Hydrofera Blue Ready Transfer Foam, 2.5x2.5 (in/in) ?Discharge Instruction: Apply Hydrofera Blue Ready to wound bed as directed ?Secondary Dressing ?(NON-BORDER) Zetuvit Plus Silicone NON-BORDER 5x5 (in/in) ?Discharge Instruction: Please do not put silicone bordered dressings under wraps. Use non-bordered dressing only under wraps. ?Secured With ?  Compression Wrap ?4-LAYER WRAP - Profore LF 4 Multi-Layer Compression Bandaging System ?Discharge Instruction: Apply 4 multi-layer wrap as directed. ?Betty Fisher, Betty Fisher ([id number]) ?Compression Stockings ?Add-Ons ?Electronic Signature(s) ?Signed: 02/13/2022 10:14:53 AM By: Gretta Cool, BSN, RN, CWS, Kim RN, BSN ?Entered By: Gretta Cool, BSN, RN, CWS, Kim on 02/13/2022 10:14:53 ?

## 2022-02-14 DIAGNOSIS — L97822 Non-pressure chronic ulcer of other part of left lower leg with fat layer exposed: Secondary | ICD-10-CM | POA: Diagnosis not present

## 2022-02-14 NOTE — Progress Notes (Signed)
Betty, Fisher ([id number]) ?Visit Report for 02/14/2022 ?Arrival Information Details ?Patient Name: Betty Fisher ?Date of Service: 02/14/2022 3:45 PM ?Medical Record Number: [id number] ?Patient Account Number: [id number] ?Date of Birth/Sex: [birth year] (26 y.o. F) ?Treating RN: Levora Dredge ?Primary Care Dionta Larke: [name] Other Clinician: ?Referring Liddy Deam: [name] ?Treating Ikaika Showers/Extender: [name] ?Weeks in Treatment: 1 ?Visit Information History Since Last Visit ?Added or deleted any medications: No ?Patient Arrived: Ambulatory ?Any new allergies or adverse reactions: No ?Arrival Time: 15:57 ?Had a fall or experienced change in No ?Accompanied By: self ?activities of daily living that may affect ?Transfer Assistance: None ?risk of falls: ?Patient Identification Verified: Yes ?Hospitalized since last visit: No ?Secondary Verification Process Completed: Yes ?Has Dressing in Place as Prescribed: No ?Patient Has Alerts: Yes ?Pain Present Now: No ?Patient Alerts: Patient on Blood Thinner ?Electronic Signature(s) ?Signed: 02/14/2022 4:40:46 PM By: Levora Dredge ?Entered By: Levora Dredge on 02/14/2022 15:57:51 ?Betty, Fisher ([id number]) ?-------------------------------------------------------------------------------- ?Clinic Level of Care Assessment Details ?Patient Name: Betty Fisher ?Date of Service: 02/14/2022 3:45 PM ?Medical Record Number: [id number] ?Patient Account Number: [id number] ?Date of Birth/Sex: 02/21/1996 (26 y.o. F) ?Treating RN: Levora Dredge ?Primary Care Murel Wigle: [name] Other Clinician: ?Referring Nixon Kolton: [name] ?Treating Tiarah Shisler/Extender: [name] ?Weeks in Treatment: 1 ?Clinic Level of Care Assessment Items ?TOOL 4 Quantity Score ?'[]'$  - Use when only an EandM is performed on FOLLOW-UP visit 0 ?ASSESSMENTS - Nursing Assessment / Reassessment ?X - Reassessment of Co-morbidities (includes updates in patient status) 1 10 ?X- 1 5 ?Reassessment of Adherence to  Treatment Plan ?ASSESSMENTS - Wound and Skin Assessment / Reassessment ?X - Simple Wound Assessment / Reassessment - one wound 1 5 ?'[]'$  - 0 ?Complex Wound Assessment / Reassessment - multiple wounds ?'[]'$  - 0 ?Dermatologic / Skin Assessment (not related to wound area) ?ASSESSMENTS - Focused Assessment ?'[]'$  - Circumferential Edema Measurements - multi extremities 0 ?'[]'$  - 0 ?Nutritional Assessment / Counseling / Intervention ?'[]'$  - 0 ?Lower Extremity Assessment (monofilament, tuning fork, pulses) ?'[]'$  - 0 ?Peripheral Arterial Disease Assessment (using hand held doppler) ?ASSESSMENTS - Ostomy and/or Continence Assessment and Care ?'[]'$  - Incontinence Assessment and Management 0 ?'[]'$  - 0 ?Ostomy Care Assessment and Management (repouching, etc.) ?PROCESS - Coordination of Care ?X - Simple Patient / Family Education for ongoing care 1 15 ?'[]'$  - 0 ?Complex (extensive) Patient / Family Education for ongoing care ?'[]'$  - 0 ?Staff obtains Consents, Records, Test Results / Process Orders ?'[]'$  - 0 ?Staff telephones HHA, Nursing Homes / Clarify orders / etc ?'[]'$  - 0 ?Routine Transfer to another Facility (non-emergent condition) ?'[]'$  - 0 ?Routine Hospital Admission (non-emergent condition) ?'[]'$  - 0 ?New Admissions / Biomedical engineer / Ordering NPWT, Apligraf, etc. ?'[]'$  - 0 ?Emergency Hospital Admission (emergent condition) ?X- 1 10 ?Simple Discharge Coordination ?'[]'$  - 0 ?Complex (extensive) Discharge Coordination ?PROCESS - Special Needs ?'[]'$  - Pediatric / Minor Patient Management 0 ?'[]'$  - 0 ?Isolation Patient Management ?'[]'$  - 0 ?Hearing / Language / Visual special needs ?'[]'$  - 0 ?Assessment of Community assistance (transportation, D/C planning, etc.) ?'[]'$  - 0 ?Additional assistance / Altered mentation ?'[]'$  - 0 ?Support Surface(s) Assessment (bed, cushion, seat, etc.) ?INTERVENTIONS - Wound Cleansing / Measurement ?Betty, Fisher ([id number]) ?X- 1 5 ?Simple Wound Cleansing - one wound ?'[]'$  - 0 ?Complex Wound Cleansing - multiple wounds ?'[]'$  -  0 ?Wound Imaging (photographs - any number of wounds) ?'[]'$  - 0 ?Wound Tracing (instead of photographs) ?'[]'$  - 0 ?Simple Wound Measurement -  one wound ?'[]'$  - 0 ?Complex Wound Measurement - multiple wounds ?INTERVENTIONS - Wound Dressings ?X - Small Wound Dressing one or multiple wounds 1 10 ?'[]'$  - 0 ?Medium Wound Dressing one or multiple wounds ?'[]'$  - 0 ?Large Wound Dressing one or multiple wounds ?'[]'$  - 0 ?Application of Medications - topical ?'[]'$  - 0 ?Application of Medications - injection ?INTERVENTIONS - Miscellaneous ?'[]'$  - External ear exam 0 ?'[]'$  - 0 ?Specimen Collection (cultures, biopsies, blood, body fluids, etc.) ?'[]'$  - 0 ?Specimen(s) / Culture(s) sent or taken to Lab for analysis ?'[]'$  - 0 ?Patient Transfer (multiple staff / Civil Service fast streamer / Similar devices) ?'[]'$  - 0 ?Simple Staple / Suture removal (25 or less) ?'[]'$  - 0 ?Complex Staple / Suture removal (26 or more) ?'[]'$  - 0 ?Hypo / Hyperglycemic Management (close monitor of Blood Glucose) ?'[]'$  - 0 ?Ankle / Brachial Index (ABI) - do not check if billed separately ?'[]'$  - 0 ?Vital Signs ?Has the patient been seen at the hospital within the last three years: Yes ?Total Score: 60 ?Level Of Care: New/Established - ?Level 2 ?Electronic Signature(s) ?Signed: 02/14/2022 4:40:46 PM By: Levora Dredge ?Entered By: Levora Dredge on 02/14/2022 16:17:29 ?Betty, Fisher ([id number]) ?-------------------------------------------------------------------------------- ?Encounter Discharge Information Details ?Patient Name: Betty Fisher ?Date of Service: 02/14/2022 3:45 PM ?Medical Record Number: [id number] ?Patient Account Number: [id number] ?Date of Birth/Sex: 08/12/1996 (26 y.o. F) ?Treating RN: Levora Dredge ?Primary Care Clay Solum: [name] Other Clinician: ?Referring Zanna Hawn: [name] ?Treating Jameshia Hayashida/Extender: [name] ?Weeks in Treatment: 1 ?Encounter Discharge Information Items ?Discharge Condition: Stable ?Ambulatory Status: Ambulatory ?Discharge Destination:  Home ?Transportation: Private Auto ?Accompanied By: self ?Schedule Follow-up Appointment: Yes ?Clinical Summary of Care: Patient Declined ?Electronic Signature(s) ?Signed: 02/14/2022 4:40:46 PM By: Levora Dredge ?Entered By: Levora Dredge on 02/14/2022 16:17:03 ?MAKAILAH, SLAVICK ([id number]) ?-------------------------------------------------------------------------------- ?Wound Assessment Details ?Patient Name: Betty Fisher ?Date of Service: 02/14/2022 3:45 PM ?Medical Record Number: [id number] ?Patient Account Number: [id number] ?Date of Birth/Sex: April 19, 1996 (26 y.o. F) ?Treating RN: Levora Dredge ?Primary Care Yakelin Grenier: [name] Other Clinician: ?Referring Mimie Goering: [name] ?Treating Josemiguel Gries/Extender: [name] ?Weeks in Treatment: 1 ?Wound Status ?Wound Number: 1 ?Primary Etiology: Open Surgical Wound ?Wound Location: Left, Anterior Lower Leg ?Wound Status: Open ?Wounding Event: Surgical Injury ?Date Acquired: 01/29/2020 ?Weeks Of Treatment: 1 ?Clustered Wound: No ?Wound Measurements ?Length: (cm) 1.9 ?Width: (cm) 0.7 ?Depth: (cm) 0.2 ?Area: (cm?) 1.045 ?Volume: (cm?) 0.209 ?% Reduction in Area: 0% ?% Reduction in Volume: 0% ?Epithelialization: Small (1-33%) ?Tunneling: No ?Undermining: No ?Wound Description ?Classification: Full Thickness Without Exposed Support Structures ?Exudate Amount: Medium ?Exudate Type: Serosanguineous ?Exudate Color: red, brown ?Foul Odor After Cleansing: No ?Slough/Fibrino Yes ?Wound Bed ?Granulation Amount: Medium (34-66%) Exposed Structure ?Granulation Quality: Pink, Pale ?Fat Layer (Subcutaneous Tissue) Exposed: Yes ?Necrotic Amount: Medium (34-66%) ?Necrotic Quality: Adherent Slough ?Treatment Notes ?Wound #1 (Lower Leg) Wound Laterality: Left, Anterior ?Cleanser ?Peri-Wound Care ?Topical ?Gentamicin ?Discharge Instruction: Apply as directed by Ernesto Lashway. ?Primary Dressing ?Hydrofera Blue Ready Transfer Foam, 2.5x2.5 (in/in) ?Discharge Instruction: Apply Hydrofera Blue  Ready to wound bed as directed ?Secondary Dressing ?(SILICONE BORDER) Zetuvit Plus SILICONE BORDER Dressing 4x4 (in/in) ?Discharge Instruction: Please do not put silicone bordered dressings under wraps. Use non

## 2022-02-15 NOTE — Progress Notes (Signed)
Betty Fisher, Betty Fisher ([id number]) ?Visit Report for 02/14/2022 ?Physician Orders Details ?Patient Name: Betty Fisher ?Date of Service: 02/14/2022 3:45 PM ?Medical Record Number: [id number] ?Patient Account Number: [id number] ?Date of Birth/Sex: [birth year] (26 y.o. F) ?Treating RN: Levora Dredge ?Primary Care Provider: [name] Other Clinician: ?Referring Provider: [name] ?Treating Provider/Extender: [name] ?Weeks in Treatment: 1 ?Verbal / Phone Orders: No ?Diagnosis Coding ?Follow-up Appointments ?o Return Appointment in 1 week. ?o Nurse Visit as needed ?Bathing/ Shower/ Hygiene ?o Wash wounds with antibacterial soap and water. ?o May shower with wound dressing protected with water repellent cover or cast protector. ?o No tub bath. ?Edema Control - Lymphedema / Segmental Compressive Device / Other ?o Optional: One layer of unna paste to top of compression wrap (to act as an anchor). ?o Elevate leg(s) parallel to the floor when sitting. ?o DO YOUR BEST to sleep in the bed at night. DO NOT sleep in your recliner. Long hours of sitting in a recliner leads to ?swelling of the legs and/or potential wounds on your backside. ?Wound Treatment ?Wound #1 - Lower Leg Wound Laterality: Left, Anterior ?Topical: Gentamicin 3 x Per Week/30 Days ?Discharge Instructions: Apply as directed by provider. ?Primary Dressing: Hydrofera Blue Ready Transfer Foam, 2.5x2.5 (in/in) 3 x Per Week/30 Days ?Discharge Instructions: Apply Hydrofera Blue Ready to wound bed as directed ?Secondary Dressing: (SILICONE BORDER) Zetuvit Plus SILICONE BORDER Dressing 4x4 (in/in) 3 x Per Week/30 Days ?Discharge Instructions: Please do not put silicone bordered dressings under wraps. Use non-bordered dressing only. ?Secured With: Tubigrip Size E, 3.5x10 (in/yds) 3 x Per Week/30 Days ?Discharge Instructions: Apply 3 Tubigrip E 3-finger-widths below knee to base of toes to secure dressing and/or for swelling. ?Doubled ?Electronic  Signature(s) ?Signed: 02/14/2022 4:40:46 PM By: Levora Dredge ?Signed: 02/14/2022 5:00:17 PM By: Worthy Keeler PA-C ?Entered By: Levora Dredge on 02/14/2022 16:16:33 ?Betty Fisher, Betty Fisher ([id number]) ?-------------------------------------------------------------------------------- ?SuperBill Details ?Patient Name: Betty Fisher, Betty Fisher ?Date of Service: 02/14/2022 ?Medical Record Number: [id number] ?Patient Account Number: [id number] ?Date of Birth/Sex: 11-26-1995 (26 y.o. 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

## 2022-02-21 ENCOUNTER — Encounter: Payer: 59 | Admitting: Physician Assistant

## 2022-02-21 DIAGNOSIS — L97822 Non-pressure chronic ulcer of other part of left lower leg with fat layer exposed: Secondary | ICD-10-CM | POA: Diagnosis not present

## 2022-02-21 NOTE — Progress Notes (Addendum)
CORTANA, VANDERFORD ([id number]) ?Visit Report for 02/21/2022 ?Chief Complaint Document Details ?Patient Name: Betty Fisher, Betty Fisher ?Date of Service: 02/21/2022 9:00 AM ?Medical Record Number: [id number] ?Patient Account Number: [id number] ?Date of Birth/Sex: 05-Feb-1996 (26 y.o. F) ?Treating RN: Levora Dredge ?Primary Care Provider: [name] Other Clinician: ?Referring Provider: [name] ?Treating Provider/Extender: [name] ?Weeks in Treatment: 2 ?Information Obtained from: Patient ?Chief Complaint ?Left LE Ulcer ?Electronic Signature(s) ?Signed: 02/21/2022 9:02:06 AM By: Worthy Keeler PA-C ?Entered By: Worthy Keeler on 02/21/2022 09:02:06 ?SHAKIYAH, CIRILO ([id number]) ?-------------------------------------------------------------------------------- ?HPI Details ?Patient Name: Betty Fisher, Betty Fisher ?Date of Service: 02/21/2022 9:00 AM ?Medical Record Number: [id number] ?Patient Account Number: [id number] ?Date of Birth/Sex: [birth year] (26 y.o. F) ?Treating RN: Levora Dredge ?Primary Care Provider: [name] Other Clinician: ?Referring Provider: [name] ?Treating Provider/Extender: [name] ?Weeks in Treatment: 2 ?History of Present Illness ?HPI Description: 02-06-2022 upon evaluation today patient presents for initial inspection here in our clinic concerning an issue she has been ?having with her leg. This has been going on for quite a while. In fact her incision and drainage surgery was actually performed on September ?2022. Subsequently since that time she has been trying to get this to heal and it would heal sometimes scabbing over and seem to be doing okay ?and then it would reopen again and this is been back and forth. Her leg is also been significantly swollen intermittently today is not too bad there ?is some days when it is much much worse. Actually did review some of the pictures she submitted through epic and it showed a much more ?significantly swollen leg which actually I think may be part of the  reason why this is not wanting to heal as well as it should. Subsequently she is ?also having greenish drainage coming from the wound she tells me she is unsure why. She was on Bactrim for 2 to 3 weeks but has been off of ?that for several days at this point. She did have an ultrasound on December 23, 2021 due to concerns about this worsening and it was negative for ?any signs of abscess. ?The patient does seem to have a history of potential chronic venous insufficiency. Again as we discussed her weight may have some to do with ?this and losing weight could help but at the same time I am also concerned about the fact that she has had an injury here and it may just be ?some irritation in this leg causing more significant swelling than what would otherwise normally be expected. ?02-21-2022 upon evaluation today patient's wound actually showing signs of excellent improvement and actually very pleased with where we stand ?today. There does not appear to be any evidence of infection which is great news and overall I think that we are on the right track here. In fact ?she may be almost completely closed although I still see some area where there is some weeping through where the original wound was. ?Electronic Signature(s) ?Signed: 02/21/2022 10:08:36 AM By: Worthy Keeler PA-C ?Entered By: Worthy Keeler on 02/21/2022 10:08:36 ?SHAWNTELL, DIXSON ([id number]) ?-------------------------------------------------------------------------------- ?Physical Exam Details ?Patient Name: Betty Fisher, Betty Fisher ?Date of Service: 02/21/2022 9:00 AM ?Medical Record Number: [id number] ?Patient Account Number: [id number] ?Date of Birth/Sex: 04/17/1996 (26 y.o. F) ?Treating RN: Levora Dredge ?Primary Care Provider: [name] Other Clinician: ?Referring Provider: [name] ?Treating Provider/Extender: [name] ?Weeks in Treatment: 2 ?Constitutional ?Obese and well-hydrated in no acute distress. ?Respiratory ?normal breathing without  difficulty. ?Psychiatric ?this patient  is able to make decisions and demonstrates good insight into disease process. Alert and Oriented x 3. pleasant and cooperative. ?Notes ?Upon inspection again patient really did not have any true open wound although there was a small area where there was some weeping when I ?pushed around the region. In general I think that she would benefit from continuing and finishing her oral antibiotic which has done extremely ?well for her. Also think the Tubigrip is doing well though I believe a juxta lite compression wrap would do better. ?Electronic Signature(s) ?Signed: 02/21/2022 10:09:00 AM By: Worthy Keeler PA-C ?Entered By: Worthy Keeler on 02/21/2022 10:08:59 ?IYANI, DRESNER ([id number]) ?-------------------------------------------------------------------------------- ?Physician Orders Details ?Patient Name: Betty Fisher, Betty Fisher ?Date of Service: 02/21/2022 9:00 AM ?Medical Record Number: [id number] ?Patient Account Number: [id number] ?Date of Birth/Sex: Sep 06, 1996 (26 y.o. 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 ?o Nurse Visit as needed ?Bathing/ Shower/ Hygiene ?o Wash wounds with antibacterial soap and water. ?o May shower with wound dressing protected with water repellent cover or cast protector. ?o No tub bath. ?Edema Control - Lymphedema / Segmental Compressive Device / Other ?o Optional: One layer of unna paste to top of compression wrap (to act as an anchor). ?o  Elevate leg(s) parallel to the floor when sitting. ?o DO YOUR BEST to sleep in the bed at night. DO NOT sleep in your recliner. Long hours of sitting in a recliner leads to ?swelling of the legs and/or potential wounds on your backside. ?Wound Treatment ?Wound #1 - Lower Leg Wound Laterality: Left, Anterior ?Topical: Gentamicin 3 x Per Week/30 Days ?Discharge Instructions: Apply as directed by provider. ?Primary Dressing: Hydrofera Blue Ready Transfer Foam, 2.5x2.5 (in/in) 3 x Per Week/30 Days ?Discharge Instructions: Apply Hydrofera Blue Ready to wound bed as directed ?Secondary Dressing: (SILICONE BORDER) Zetuvit Plus SILICONE BORDER Dressing 4x4 (in/in) 3 x Per Week/30 Days ?Discharge Instructions: Please do not put silicone bordered dressings under wraps. Use non-bordered dressing only. ?Secured With: Tubigrip Size E, 3.5x10 (in/yds) 3 x Per Week/30 Days ?Discharge Instructions: Apply 3 Tubigrip E 3-finger-widths below knee to base of toes to secure dressing and/or for swelling. ?Doubled ?Compression Stockings: Circaid Juxta Lite Compression Wrap (DME) ?Left Leg Compression Amount: 30-40 mmHG ?Discharge Instructions: Apply Circaid Juxta Lite Compression Wrap as directed ?Electronic Signature(s) ?Signed: 02/21/2022 4:54:44 PM By: Levora Dredge ?Signed: 02/21/2022 6:11:27 PM By: Worthy Keeler PA-C ?Entered By: Levora Dredge on 02/21/2022 11:30:18 ?TRIVIA, HEFFELFINGER ([id number]) ?-------------------------------------------------------------------------------- ?Problem List Details ?Patient Name: RANDE, DARIO ?Date of Service: 02/21/2022 9:00 AM ?Medical Record Number: [id number] ?Patient Account Number: [id number] ?Date of Birth/Sex: [birth year] (26 y.o. F) ?Treating RN: Levora Dredge ?Primary Care Provider: [name] Other Clinician: ?Referring Provider: [name] ?Treating Provider/Extender: [name] ?Weeks in Treatment: 2 ?Active Problems ?ICD-10 ?Encounter ?Code Description Active Date  MDM ?Diagnosis ?H54.562 Chronic venous hypertension (idiopathic) with ulcer of left lower 02/21/2022 No Yes ?extremity ?B63.893 Non-pressure chronic ulcer of other part of left lower leg with fat layer 02/06/2022 No Yes ?exposed

## 2022-02-21 NOTE — Progress Notes (Addendum)
TRULEE, HAMSTRA ([id number]) ?Visit Report for 02/21/2022 ?Arrival Information Details ?Patient Name: MERLEEN, PICAZO ?Date of Service: 02/21/2022 9:00 AM ?Medical Record Number: [id number] ?Patient Account Number: [id number] ?Date of Birth/Sex: [birth year] (26 y.o. F) ?Treating RN: Levora Dredge ?Primary Care Katlynn Naser: [name] Other Clinician: ?Referring Niam Nepomuceno: [name] ?Treating Seline Enzor/Extender: [name] ?Weeks in Treatment: 2 ?Visit Information History Since Last Visit ?Added or deleted any medications: No ?Patient Arrived: Ambulatory ?Any new allergies or adverse reactions: No ?Arrival Time: 08:59 ?Had a fall or experienced change in No ?Accompanied By: self ?activities of daily living that may affect ?Transfer Assistance: None ?risk of falls: ?Patient Identification Verified: Yes ?Hospitalized since last visit: No ?Secondary Verification Process Completed: Yes ?Has Dressing in Place as Prescribed: Yes ?Patient Has Alerts: Yes ?Pain Present Now: No ?Patient Alerts: Patient on Blood Thinner ?Electronic Signature(s) ?Signed: 02/21/2022 4:54:44 PM By: Levora Dredge ?Entered By: Levora Dredge on 02/21/2022 09:03:02 ?KATERA, RYBKA ([id number]) ?-------------------------------------------------------------------------------- ?Clinic Level of Care Assessment Details ?Patient Name: JAYLE, SOLARZ ?Date of Service: 02/21/2022 9:00 AM ?Medical Record Number: [id number] ?Patient Account Number: [id number] ?Date of Birth/Sex: August 07, 1996 (26 y.o. F) ?Treating RN: Levora Dredge ?Primary Care Adiba Fargnoli: [name] Other Clinician: ?Referring Savir Blanke: [name] ?Treating Antha Niday/Extender: [name] ?Weeks in Treatment: 2 ?Clinic Level of Care Assessment Items ?TOOL 4 Quantity Score ?'[]'$  - Use when only an EandM is performed on FOLLOW-UP visit 0 ?ASSESSMENTS - Nursing Assessment / Reassessment ?X - Reassessment of Co-morbidities (includes updates in patient status) 1 10 ?X- 1 5 ?Reassessment of Adherence to  Treatment Plan ?ASSESSMENTS - Wound and Skin Assessment / Reassessment ?X - Simple Wound Assessment / Reassessment - one wound 1 5 ?'[]'$  - 0 ?Complex Wound Assessment / Reassessment - multiple wounds ?'[]'$  - 0 ?Dermatologic / Skin Assessment (not related to wound area) ?ASSESSMENTS - Focused Assessment ?X - Circumferential Edema Measurements - multi extremities 1 5 ?'[]'$  - 0 ?Nutritional Assessment / Counseling / Intervention ?'[]'$  - 0 ?Lower Extremity Assessment (monofilament, tuning fork, pulses) ?'[]'$  - 0 ?Peripheral Arterial Disease Assessment (using hand held doppler) ?ASSESSMENTS - Ostomy and/or Continence Assessment and Care ?'[]'$  - Incontinence Assessment and Management 0 ?'[]'$  - 0 ?Ostomy Care Assessment and Management (repouching, etc.) ?PROCESS - Coordination of Care ?X - Simple Patient / Family Education for ongoing care 1 15 ?'[]'$  - 0 ?Complex (extensive) Patient / Family Education for ongoing care ?'[]'$  - 0 ?Staff obtains Consents, Records, Test Results / Process Orders ?'[]'$  - 0 ?Staff telephones HHA, Nursing Homes / Clarify orders / etc ?'[]'$  - 0 ?Routine Transfer to another Facility (non-emergent condition) ?'[]'$  - 0 ?Routine Hospital Admission (non-emergent condition) ?'[]'$  - 0 ?New Admissions / Biomedical engineer / Ordering NPWT, Apligraf, etc. ?'[]'$  - 0 ?Emergency Hospital Admission (emergent condition) ?X- 1 10 ?Simple Discharge Coordination ?'[]'$  - 0 ?Complex (extensive) Discharge Coordination ?PROCESS - Special Needs ?'[]'$  - Pediatric / Minor Patient Management 0 ?'[]'$  - 0 ?Isolation Patient Management ?'[]'$  - 0 ?Hearing / Language / Visual special needs ?'[]'$  - 0 ?Assessment of Community assistance (transportation, D/C planning, etc.) ?'[]'$  - 0 ?Additional assistance / Altered mentation ?'[]'$  - 0 ?Support Surface(s) Assessment (bed, cushion, seat, etc.) ?INTERVENTIONS - Wound Cleansing / Measurement ?KEYUNA, CUTHRELL ([id number]) ?X- 1 5 ?Simple Wound Cleansing - one wound ?'[]'$  - 0 ?Complex Wound Cleansing - multiple wounds ?X- 1  5 ?Wound Imaging (photographs - any number of wounds) ?'[]'$  - 0 ?Wound Tracing (instead of photographs) ?X- 1 5 ?Simple Wound Measurement -  one wound ?'[]'$  - 0 ?Complex Wound Measurement - multiple wounds ?INTERVENTIONS - Wound Dressings ?X - Small Wound Dressing one or multiple wounds 1 10 ?'[]'$  - 0 ?Medium Wound Dressing one or multiple wounds ?'[]'$  - 0 ?Large Wound Dressing one or multiple wounds ?'[]'$  - 0 ?Application of Medications - topical ?'[]'$  - 0 ?Application of Medications - injection ?INTERVENTIONS - Miscellaneous ?'[]'$  - External ear exam 0 ?'[]'$  - 0 ?Specimen Collection (cultures, biopsies, blood, body fluids, etc.) ?'[]'$  - 0 ?Specimen(s) / Culture(s) sent or taken to Lab for analysis ?'[]'$  - 0 ?Patient Transfer (multiple staff / Civil Service fast streamer / Similar devices) ?'[]'$  - 0 ?Simple Staple / Suture removal (25 or less) ?'[]'$  - 0 ?Complex Staple / Suture removal (26 or more) ?'[]'$  - 0 ?Hypo / Hyperglycemic Management (close monitor of Blood Glucose) ?'[]'$  - 0 ?Ankle / Brachial Index (ABI) - do not check if billed separately ?X- 1 5 ?Vital Signs ?Has the patient been seen at the hospital within the last three years: Yes ?Total Score: 80 ?Level Of Care: New/Established - Level ?3 ?Electronic Signature(s) ?Signed: 02/21/2022 4:54:44 PM By: Levora Dredge ?Entered By: Levora Dredge on 02/21/2022 16:40:51 ?SHARISSA, BRIERLEY ([id number]) ?-------------------------------------------------------------------------------- ?Encounter Discharge Information Details ?Patient Name: CHRYSTA, FULCHER ?Date of Service: 02/21/2022 9:00 AM ?Medical Record Number: [id number] ?Patient Account Number: [id number] ?Date of Birth/Sex: 07-24-1996 (26 y.o. F) ?Treating RN: Levora Dredge ?Primary Care Mickey Hebel: [name] Other Clinician: ?Referring Demaree Liberto: [name] ?Treating Mihika Surrette/Extender: [name] ?Weeks in Treatment: 2 ?Encounter Discharge Information Items ?Discharge Condition: Stable ?Ambulatory Status: Ambulatory ?Discharge Destination:  Home ?Transportation: Private Auto ?Accompanied By: self ?Schedule Follow-up Appointment: Yes ?Clinical Summary of Care: Patient Declined ?Electronic Signature(s) ?Signed: 02/21/2022 4:41:46 PM By: Levora Dredge ?Entered By: Levora Dredge on 02/21/2022 16:41:46 ?FARIA, CASELLA ([id number]) ?-------------------------------------------------------------------------------- ?Lower Extremity Assessment Details ?Patient Name: MICKELLE, GOUPIL ?Date of Service: 02/21/2022 9:00 AM ?Medical Record Number: [id number] ?Patient Account Number: [id number] ?Date of Birth/Sex: [birth year] (26 y.o. F) ?Treating RN: Levora Dredge ?Primary Care Marli Diego: [name] Other Clinician: ?Referring Oliviagrace Crisanti: [name] ?Treating Nevada Kirchner/Extender: [name] ?Weeks in Treatment: 2 ?Edema Assessment ?Assessed: [Left: No] [Right: No] ?Edema: [Left: Ye] [Right: s] ?Calf ?Left: Right: ?Point of Measurement: 30 cm From Medial Instep 54.5 cm ?Ankle ?Left: Right: ?Point of Measurement: 9 cm From Medial Instep 29.2 cm ?Knee To Floor ?Left: Right: ?From Medial Instep 37 cm ?Vascular Assessment ?Pulses: ?Dorsalis Pedis ?Palpable: [Left:Yes] ?Electronic Signature(s) ?Signed: 02/21/2022 4:54:44 PM By: Levora Dredge ?Entered By: Levora Dredge on 02/21/2022 09:49:34 ?BLAKELEY, SCHEIER ([id number]) ?-------------------------------------------------------------------------------- ?Multi Wound Chart Details ?Patient Name: LANEYA, GASAWAY ?Date of Service: 02/21/2022 9:00 AM ?Medical Record Number: [id number] ?Patient Account Number: [id number] ?Date of Birth/Sex: [birth year] (26 y.o. F) ?Treating RN: Levora Dredge ?Primary Care Yakov Bergen: [name] Other Clinician: ?Referring Gwenevere Goga: [name] ?Treating Kashari Chalmers/Extender: [name] ?Weeks in Treatment: 2 ?Vital Signs ?Height(in): 67 ?Pulse(bpm): 80 ?Weight(lbs): 363 ?Blood Pressure(mmHg): 153/106 ?Body Mass Index(BMI): 56.8 ?Temperature(??F): 98.1 ?Respiratory Rate(breaths/min): 18 ?Photos:  [N/A:N/A] ?Wound Location: Left, Anterior Lower Leg N/A N/A ?Wounding Event: Surgical Injury N/A N/A ?Primary Etiology: Open Surgical Wound N/A N/A ?Date Acquired: 01/29/2020 N/A N/A ?Weeks of Treatment: 2 N/A N/A ?W

## 2022-02-28 ENCOUNTER — Encounter: Payer: 59 | Attending: Physician Assistant | Admitting: Physician Assistant

## 2022-02-28 DIAGNOSIS — Z6841 Body Mass Index (BMI) 40.0 and over, adult: Secondary | ICD-10-CM | POA: Insufficient documentation

## 2022-02-28 DIAGNOSIS — I87312 Chronic venous hypertension (idiopathic) with ulcer of left lower extremity: Secondary | ICD-10-CM | POA: Diagnosis not present

## 2022-02-28 DIAGNOSIS — L97822 Non-pressure chronic ulcer of other part of left lower leg with fat layer exposed: Secondary | ICD-10-CM | POA: Insufficient documentation

## 2022-02-28 NOTE — Progress Notes (Signed)
Betty Fisher, Betty Fisher ([id number]) ?Visit Report for 02/28/2022 ?Arrival Information Details ?Patient Name: Betty Fisher, Betty Fisher ?Date of Service: 02/28/2022 9:15 AM ?Medical Record Number: [id number] ?Patient Account Number: [id number] ?Date of Birth/Sex: 1995/11/30 (26 y.o. F) ?Treating RN: Donnamarie Poag ?Primary Care Audery Wassenaar: [name] Other Clinician: ?Referring Anne Boltz: [name] ?Treating Morio Widen/Extender: [name] ?Weeks in Treatment: 3 ?Visit Information History Since Last Visit ?Added or deleted any medications: No ?Patient Arrived: Ambulatory ?Had a fall or experienced change in No ?Arrival Time: 09:22 ?activities of daily living that may affect ?Accompanied By: self ?risk of falls: ?Transfer Assistance: None ?Hospitalized since last visit: No ?Patient Identification Verified: Yes ?Has Dressing in Place as Prescribed: Yes ?Secondary Verification Process Completed: Yes ?Pain Present Now: Yes ?Patient Has Alerts: Yes ?Patient Alerts: Patient on Blood Thinner ?Electronic Signature(s) ?Signed: 02/28/2022 4:17:52 PM By: Donnamarie Poag ?Entered ByDonnamarie Poag on 02/28/2022 09:24:00 ?Betty Fisher, Betty Fisher ([id number]) ?-------------------------------------------------------------------------------- ?Clinic Level of Care Assessment Details ?Patient Name: TIWANA, CHAVIS ?Date of Service: 02/28/2022 9:15 AM ?Medical Record Number: [id number] ?Patient Account Number: [id number] ?Date of Birth/Sex: November 18, 1995 (26 y.o. F) ?Treating RN: Donnamarie Poag ?Primary Care Shaindel Sweeten: [name] Other Clinician: ?Referring Cejay Cambre: [name] ?Treating [place] Natarajan/Extender: [name] ?Weeks in Treatment: 3 ?Clinic Level of Care Assessment Items ?TOOL 4 Quantity Score ?'[]'$  - Use when only an EandM is performed on FOLLOW-UP visit 0 ?ASSESSMENTS - Nursing Assessment / Reassessment ?'[]'$  - Reassessment of Co-morbidities (includes updates in patient status) 0 ?'[]'$  - 0 ?Reassessment of Adherence to Treatment Plan ?ASSESSMENTS - Wound and Skin Assessment /  Reassessment ?X - Simple Wound Assessment / Reassessment - one wound 1 5 ?'[]'$  - 0 ?Complex Wound Assessment / Reassessment - multiple wounds ?'[]'$  - 0 ?Dermatologic / Skin Assessment (not related to wound area) ?ASSESSMENTS - Focused Assessment ?X - Circumferential Edema Measurements - multi extremities 1 5 ?'[]'$  - 0 ?Nutritional Assessment / Counseling / Intervention ?'[]'$  - 0 ?Lower Extremity Assessment (monofilament, tuning fork, pulses) ?'[]'$  - 0 ?Peripheral Arterial Disease Assessment (using hand held doppler) ?ASSESSMENTS - Ostomy and/or Continence Assessment and Care ?'[]'$  - Incontinence Assessment and Management 0 ?'[]'$  - 0 ?Ostomy Care Assessment and Management (repouching, etc.) ?PROCESS - Coordination of Care ?X - Simple Patient / Family Education for ongoing care 1 15 ?'[]'$  - 0 ?Complex (extensive) Patient / Family Education for ongoing care ?'[]'$  - 0 ?Staff obtains Consents, Records, Test Results / Process Orders ?'[]'$  - 0 ?Staff telephones HHA, Nursing Homes / Clarify orders / etc ?'[]'$  - 0 ?Routine Transfer to another Facility (non-emergent condition) ?'[]'$  - 0 ?Routine Hospital Admission (non-emergent condition) ?'[]'$  - 0 ?New Admissions / Biomedical engineer / Ordering NPWT, Apligraf, etc. ?'[]'$  - 0 ?Emergency Hospital Admission (emergent condition) ?X- 1 10 ?Simple Discharge Coordination ?'[]'$  - 0 ?Complex (extensive) Discharge Coordination ?PROCESS - Special Needs ?'[]'$  - Pediatric / Minor Patient Management 0 ?'[]'$  - 0 ?Isolation Patient Management ?'[]'$  - 0 ?Hearing / Language / Visual special needs ?'[]'$  - 0 ?Assessment of Community assistance (transportation, D/C planning, etc.) ?'[]'$  - 0 ?Additional assistance / Altered mentation ?'[]'$  - 0 ?Support Surface(s) Assessment (bed, cushion, seat, etc.) ?INTERVENTIONS - Wound Cleansing / Measurement ?Betty Fisher, Betty Fisher ([id number]) ?X- 1 5 ?Simple Wound Cleansing - one wound ?'[]'$  - 0 ?Complex Wound Cleansing - multiple wounds ?X- 1 5 ?Wound Imaging (photographs - any number of wounds) ?'[]'$   - 0 ?Wound Tracing (instead of photographs) ?X- 1 5 ?Simple Wound Measurement - one wound ?'[]'$  - 0 ?Complex Wound  Measurement - multiple wounds ?INTERVENTIONS - Wound Dressings ?X - Small Wound Dressing one or multiple wounds 1 10 ?'[]'$  - 0 ?Medium Wound Dressing one or multiple wounds ?'[]'$  - 0 ?Large Wound Dressing one or multiple wounds ?X- 1 5 ?Application of Medications - topical ?'[]'$  - 0 ?Application of Medications - injection ?INTERVENTIONS - Miscellaneous ?'[]'$  - External ear exam 0 ?'[]'$  - 0 ?Specimen Collection (cultures, biopsies, blood, body fluids, etc.) ?'[]'$  - 0 ?Specimen(s) / Culture(s) sent or taken to Lab for analysis ?'[]'$  - 0 ?Patient Transfer (multiple staff / Civil Service fast streamer / Similar devices) ?'[]'$  - 0 ?Simple Staple / Suture removal (25 or less) ?'[]'$  - 0 ?Complex Staple / Suture removal (26 or more) ?'[]'$  - 0 ?Hypo / Hyperglycemic Management (close monitor of Blood Glucose) ?'[]'$  - 0 ?Ankle / Brachial Index (ABI) - do not check if billed separately ?X- 1 5 ?Vital Signs ?Has the patient been seen at the hospital within the last three years: Yes ?Total Score: 70 ?Level Of Care: New/Established - Level ?2 ?Electronic Signature(s) ?Signed: 02/28/2022 4:17:52 PM By: Donnamarie Poag ?Entered ByDonnamarie Poag on 02/28/2022 10:02:05 ?Betty Fisher, Betty Fisher ([id number]) ?-------------------------------------------------------------------------------- ?Encounter Discharge Information Details ?Patient Name: Betty Fisher, Betty Fisher ?Date of Service: 02/28/2022 9:15 AM ?Medical Record Number: [id number] ?Patient Account Number: [id number] ?Date of Birth/Sex: [birth year] (26 y.o. F) ?Treating RN: Donnamarie Poag ?Primary Care Caidon Foti: [name] Other Clinician: ?Referring Elesa Garman: [name] ?Treating Adanna Zuckerman/Extender: [name] ?Weeks in Treatment: 3 ?Encounter Discharge Information Items ?Discharge Condition: Stable ?Ambulatory Status: Ambulatory ?Discharge Destination: Home ?Transportation: Private Auto ?Accompanied By: self ?Schedule Follow-up  Appointment: Yes ?Clinical Summary of Care: ?Electronic Signature(s) ?Signed: 02/28/2022 4:17:52 PM By: Donnamarie Poag ?Entered ByDonnamarie Poag on 02/28/2022 10:09:40 ?Betty Fisher, Betty Fisher ([id number]) ?-------------------------------------------------------------------------------- ?Lower Extremity Assessment Details ?Patient Name: Betty Fisher, Betty Fisher ?Date of Service: 02/28/2022 9:15 AM ?Medical Record Number: [id number] ?Patient Account Number: [id number] ?Date of Birth/Sex: [birth year] (26 y.o. F) ?Treating RN: Donnamarie Poag ?Primary Care Larin Weissberg: [name] Other Clinician: ?Referring Geovanny Sartin: [name] ?Treating Tallin Hart/Extender: [name] ?Weeks in Treatment: 3 ?Edema Assessment ?Assessed: [Left: Yes] [Right: No] ?Edema: [Left: Ye] [Right: s] ?Calf ?Left: Right: ?Point of Measurement: 30 cm From Medial Instep 53.5 cm ?Ankle ?Left: Right: ?Point of Measurement: 9 cm From Medial Instep 29 cm ?Knee To Floor ?Left: Right: ?From Medial Instep 37 cm ?Vascular Assessment ?Pulses: ?Dorsalis Pedis ?Palpable: [Left:Yes] ?Electronic Signature(s) ?Signed: 02/28/2022 4:17:52 PM By: Donnamarie Poag ?Entered ByDonnamarie Poag on 02/28/2022 09:29:43 ?Betty Fisher, Betty Fisher ([id number]) ?-------------------------------------------------------------------------------- ?Multi Wound Chart Details ?Patient Name: Betty Fisher, Betty Fisher ?Date of Service: 02/28/2022 9:15 AM ?Medical Record Number: [id number] ?Patient Account Number: [id number] ?Date of Birth/Sex: [birth year] (26 y.o. F) ?Treating RN: Donnamarie Poag ?Primary Care Farren Landa: [name] Other Clinician: ?Referring Isaiah Cianci: [name] ?Treating Caydence Koenig/Extender: [name] ?Weeks in Treatment: 3 ?Vital Signs ?Height(in): 67 ?Pulse(bpm): 90 ?Weight(lbs): 363 ?Blood Pressure(mmHg): 167/97 ?Body Mass Index(BMI): 56.8 ?Temperature(??F): 98.1 ?Respiratory Rate(breaths/min): 16 ?Photos: [N/A:N/A] ?Wound Location: Left, Anterior Lower Leg N/A N/A ?Wounding Event: Surgical Injury N/A N/A ?Primary Etiology: Open  Surgical Wound N/A N/A ?Date Acquired: 01/29/2020 N/A N/A ?Weeks of Treatment: 3 N/A N/A ?Wound Status: Open N/A N/A ?Wound Recurrence: No N/A N/A ?Measurements L x W x D (cm) 0.1x0.1x0.1 N/A N/A ?Area (cm?) : 0.008 N

## 2022-02-28 NOTE — Progress Notes (Addendum)
LORISA, SCHEID ([id number]) ?Visit Report for 02/28/2022 ?Chief Complaint Document Details ?Patient Name: Betty Fisher, Betty Fisher ?Date of Service: 02/28/2022 9:15 AM ?Medical Record Number: [id number] ?Patient Account Number: [id number] ?Date of Birth/Sex: [birth year] (26 y.o. F) ?Treating RN: Donnamarie Poag ?Primary Care Provider: [name] Other Clinician: ?Referring Provider: [name] ?Treating Provider/Extender: [name] ?Weeks in Treatment: 3 ?Information Obtained from: Patient ?Chief Complaint ?Left LE Ulcer ?Electronic Signature(s) ?Signed: 02/28/2022 9:59:29 AM By: Worthy Keeler PA-C ?Entered By: Worthy Keeler on 02/28/2022 09:59:29 ?ARIHANA, AMBROCIO ([id number]) ?-------------------------------------------------------------------------------- ?HPI Details ?Patient Name: Betty Fisher, Betty Fisher ?Date of Service: 02/28/2022 9:15 AM ?Medical Record Number: [id number] ?Patient Account Number: [id number] ?Date of Birth/Sex: 03-Aug-1996 (26 y.o. F) ?Treating RN: Donnamarie Poag ?Primary Care Provider: [name] Other Clinician: ?Referring Provider: [name] ?Treating Provider/Extender: [name] ?Weeks in Treatment: 3 ?History of Present Illness ?HPI Description: 02-06-2022 upon evaluation today patient presents for initial inspection here in our clinic concerning an issue she has been ?having with her leg. This has been going on for quite a while. In fact her incision and drainage surgery was actually performed on September ?2022. Subsequently since that time she has been trying to get this to heal and it would heal sometimes scabbing over and seem to be doing okay ?and then it would reopen again and this is been back and forth. Her leg is also been significantly swollen intermittently today is not too bad there ?is some days when it is much much worse. Actually did review some of the pictures she submitted through epic and it showed a much more ?significantly swollen leg which actually I think may be part of the reason why this  is not wanting to heal as well as it should. Subsequently she is ?also having greenish drainage coming from the wound she tells me she is unsure why. She was on Bactrim for 2 to 3 weeks but has been off of ?that for several days at this point. She did have an ultrasound on December 23, 2021 due to concerns about this worsening and it was negative for ?any signs of abscess. ?The patient does seem to have a history of potential chronic venous insufficiency. Again as we discussed her weight may have some to do with ?this and losing weight could help but at the same time I am also concerned about the fact that she has had an injury here and it may just be ?some irritation in this leg causing more significant swelling than what would otherwise normally be expected. ?02-21-2022 upon evaluation today patient's wound actually showing signs of excellent improvement and actually very pleased with where we stand ?today. There does not appear to be any evidence of infection which is great news and overall I think that we are on the right track here. In fact ?she may be almost completely closed although I still see some area where there is some weeping through where the original wound was. ?02-28-2022 upon evaluation today patient appears to be doing well with regard to her left leg. I definitely see signs of good improvement which is ?great news and overall very pleased with where we stand at this point. I do not see any evidence of active infection locally or systemically which is ?great news and overall I think that we are headed in the right direction here. ?Electronic Signature(s) ?Signed: 02/28/2022 2:19:31 PM By: Worthy Keeler PA-C ?Previous Signature: 02/28/2022 2:18:25 PM Version By: Worthy Keeler PA-C ?Entered By: Melburn Hake,  Nihira Puello on 02/28/2022 14:19:31 ?Betty Fisher, Betty Fisher ([id number]) ?-------------------------------------------------------------------------------- ?Physical Exam Details ?Patient Name: Betty Fisher, Betty Fisher ?Date  of Service: 02/28/2022 9:15 AM ?Medical Record Number: [id number] ?Patient Account Number: [id number] ?Date of Birth/Sex: 06-Jul-1996 (26 y.o. F) ?Treating RN: Donnamarie Poag ?Primary Care Provider: [name] Other Clinician: ?Referring Provider: [name] ?Treating Provider/Extender: [name] ?Weeks in Treatment: 3 ?Constitutional ?Obese and well-hydrated in no acute distress. ?Respiratory ?normal breathing without difficulty. ?Psychiatric ?this patient is able to make decisions and demonstrates good insight into disease process. Alert and Oriented x 3. pleasant and cooperative. ?Notes ?Upon inspection patient does not show any signs of active infection at this time which is great news. Overall I am actually very pleased with where ?we stand I do believe that the biggest issue here is simply her swelling of the lower extremity which again I think she may benefit from going ?back to see vascular to discuss options from the standpoint of what can be done to help with the edema long-term. ?Electronic Signature(s) ?Signed: 02/28/2022 2:20:00 PM By: Worthy Keeler PA-C ?Entered By: Worthy Keeler on 02/28/2022 14:20:00 ?Betty Fisher, Betty Fisher ([id number]) ?-------------------------------------------------------------------------------- ?Physician Orders Details ?Patient Name: Betty Fisher, Betty Fisher ?Date of Service: 02/28/2022 9:15 AM ?Medical Record Number: [id number] ?Patient Account Number: [id number] ?Date of Birth/Sex: 08/24/1996 (26 y.o. 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 ?o Nurse Visit as needed ?Bathing/ Shower/ Hygiene ?o Wash wounds with antibacterial soap and water. ?o No tub bath. ?Anesthetic (Use 'Patient Medications' Section for Anesthetic Order Entry) ?o Lidocaine applied to wound bed ?Edema Control - Lymphedema / Segmental Compressive Device / Other ?o Elevate leg(s) parallel to the floor when sitting. ?o DO YOUR BEST to sleep in the bed at night. DO NOT sleep in your recliner. Long hours of sitting in a recliner leads to ?swelling of the legs and/or potential wounds on your backside. ?Additional Orders / Instructions ?o Follow Nutritious Diet and Increase Protein Intake ?Wound Treatment ?Wound #1 - Lower Leg Wound Laterality: Left, Anterior ?Cleanser: Soap and Water 3 x Per Week/30 Days ?Discharge Instructions: Gently cleanse wound with antibacterial soap, rinse and pat dry prior to dressing wounds ?Cleanser: Wound Cleanser 3 x Per Week/30 Days ?Discharge Instructions: Wash your hands with soap and water. Remove old dressing, discard into plastic bag and place into trash. ?Cleanse the wound with Wound Cleanser prior to applying a clean dressing using gauze sponges, not tissues or cotton balls. Do not ?scrub or use excessive force. Pat dry using gauze sponges, not tissue or cotton balls. ?Topical: Gentamicin 3 x Per Week/30 Days ?Discharge Instructions: In office-Apply as directed by provider. ?Primary Dressing: Hydrofera Blue Ready Transfer Foam, 2.5x2.5 (in/in) 3 x Per Week/30 Days ?Discharge Instructions: Apply Hydrofera Blue Ready to wound bed as directed ?Secondary Dressing: (SILICONE BORDER) Zetuvit Plus SILICONE BORDER Dressing 4x4 (in/in) 3 x Per Week/30 Days ?Discharge Instructions: Please do not put silicone bordered dressings under wraps. Use non-bordered dressing only. ?Secured With: Tubigrip Size E, 3.5x10 (in/yds) 3 x Per Week/30 Days ?Discharge Instructions: DOUBLE-Apply 3 Tubigrip E  3-finger-widths below knee to  base of toes to secure dressing and/or for ?swelling. Doubled ?Compression Stockings: Jobst Francia Greaves (DME) ?Left Leg Compression Amount: 30-40 mmHG ?Discharge Instructions: Apply Allayne Butcher

## 2022-03-01 ENCOUNTER — Telehealth (INDEPENDENT_AMBULATORY_CARE_PROVIDER_SITE_OTHER): Payer: Self-pay | Admitting: Vascular Surgery

## 2022-03-01 NOTE — Telephone Encounter (Signed)
Pt has referral sent over from [facility] wanting to know if anything can be done from a vascular standpoint to help the swelling/ evaluate post wound. She does however ONLY want to see Dr. Lucky Cowboy. Please advise if we can switch her Dr. Lucky Cowboy from Dr. Delana Meyer.  Dr. Delana Meyer - [place] 05/2021 - saw Eulogio Ditch, NP 08.17.22 .  ?

## 2022-03-07 ENCOUNTER — Telehealth: Payer: Self-pay

## 2022-03-07 ENCOUNTER — Encounter: Payer: 59 | Admitting: Physician Assistant

## 2022-03-07 DIAGNOSIS — I87312 Chronic venous hypertension (idiopathic) with ulcer of left lower extremity: Secondary | ICD-10-CM | POA: Diagnosis not present

## 2022-03-07 NOTE — Progress Notes (Addendum)
Betty Fisher, Betty Fisher ([id number]) ?Visit Report for 03/07/2022 ?Arrival Information Details ?Patient Name: Betty Fisher ?Date of Service: 03/07/2022 3:30 PM ?Medical Record Number: [id number] ?Patient Account Number: [id number] ?Date of Birth/Sex: [birth year] (26 y.o. F) ?Treating RN: Donnamarie Poag ?Primary Care Lya Holben: [name] Other Clinician: ?Referring Darnita Woodrum: [name] ?Treating Rolen Conger/Extender: [name] ?Weeks in Treatment: 4 ?Visit Information History Since Last Visit ?Added or deleted any medications: No ?Patient Arrived: Ambulatory ?Had a fall or experienced change in No ?Arrival Time: 15:52 ?activities of daily living that may affect ?Accompanied By: self ?risk of falls: ?Transfer Assistance: None ?Hospitalized since last visit: No ?Patient Identification Verified: Yes ?Has Dressing in Place as Prescribed: Yes ?Secondary Verification Process Completed: Yes ?Has Compression in Place as Prescribed: Yes ?Patient Requires Transmission-Based No ?Pain Present Now: No ?Precautions: ?Patient Has Alerts: Yes ?Patient Alerts: Patient on Blood ?Thinner ?Electronic Signature(s) ?Signed: 03/07/2022 4:30:09 PM By: Donnamarie Poag ?Entered ByDonnamarie Poag on 03/07/2022 15:54:33 ?Betty Fisher, Betty Fisher ([id number]) ?-------------------------------------------------------------------------------- ?Clinic Level of Care Assessment Details ?Patient Name: Betty Fisher ?Date of Service: 03/07/2022 3:30 PM ?Medical Record Number: [id number] ?Patient Account Number: [id number] ?Date of Birth/Sex: 10-Mar-1996 (26 y.o. F) ?Treating RN: Donnamarie Poag ?Primary Care Gibran Veselka: [name] Other Clinician: ?Referring Jovanni Rash: [name] ?Treating Dariona Postma/Extender: [name] ?Weeks in Treatment: 4 ?Clinic Level of Care Assessment Items ?TOOL 4 Quantity Score ?'[]'$  - Use when only an EandM is performed on FOLLOW-UP visit 0 ?ASSESSMENTS - Nursing Assessment / Reassessment ?'[]'$  - Reassessment of Co-morbidities (includes updates in patient status) 0 ?'[]'$   - 0 ?Reassessment of Adherence to Treatment Plan ?ASSESSMENTS - Wound and Skin Assessment / Reassessment ?X - Simple Wound Assessment / Reassessment - one wound 1 5 ?'[]'$  - 0 ?Complex Wound Assessment / Reassessment - multiple wounds ?'[]'$  - 0 ?Dermatologic / Skin Assessment (not related to wound area) ?ASSESSMENTS - Focused Assessment ?'[]'$  - Circumferential Edema Measurements - multi extremities 0 ?'[]'$  - 0 ?Nutritional Assessment / Counseling / Intervention ?'[]'$  - 0 ?Lower Extremity Assessment (monofilament, tuning fork, pulses) ?'[]'$  - 0 ?Peripheral Arterial Disease Assessment (using hand held doppler) ?ASSESSMENTS - Ostomy and/or Continence Assessment and Care ?'[]'$  - Incontinence Assessment and Management 0 ?'[]'$  - 0 ?Ostomy Care Assessment and Management (repouching, etc.) ?PROCESS - Coordination of Care ?X - Simple Patient / Family Education for ongoing care 1 15 ?'[]'$  - 0 ?Complex (extensive) Patient / Family Education for ongoing care ?'[]'$  - 0 ?Staff obtains Consents, Records, Test Results / Process Orders ?'[]'$  - 0 ?Staff telephones HHA, Nursing Homes / Clarify orders / etc ?'[]'$  - 0 ?Routine Transfer to another Facility (non-emergent condition) ?'[]'$  - 0 ?Routine Hospital Admission (non-emergent condition) ?'[]'$  - 0 ?New Admissions / Biomedical engineer / Ordering NPWT, Apligraf, etc. ?'[]'$  - 0 ?Emergency Hospital Admission (emergent condition) ?X- 1 10 ?Simple Discharge Coordination ?'[]'$  - 0 ?Complex (extensive) Discharge Coordination ?PROCESS - Special Needs ?'[]'$  - Pediatric / Minor Patient Management 0 ?'[]'$  - 0 ?Isolation Patient Management ?'[]'$  - 0 ?Hearing / Language / Visual special needs ?'[]'$  - 0 ?Assessment of Community assistance (transportation, D/C planning, etc.) ?'[]'$  - 0 ?Additional assistance / Altered mentation ?'[]'$  - 0 ?Support Surface(s) Assessment (bed, cushion, seat, etc.) ?INTERVENTIONS - Wound Cleansing / Measurement ?Betty Fisher, Betty Fisher ([id number]) ?X- 1 5 ?Simple Wound Cleansing - one wound ?'[]'$  - 0 ?Complex Wound  Cleansing - multiple wounds ?X- 1 5 ?Wound Imaging (photographs - any number of wounds) ?'[]'$  - 0 ?Wound Tracing (instead of photographs) ?X- 1 5 ?  Simple Wound Measurement - one wound ?'[]'$  - 0 ?Complex Wound Measurement - multiple wounds ?INTERVENTIONS - Wound Dressings ?X - Small Wound Dressing one or multiple wounds 1 10 ?'[]'$  - 0 ?Medium Wound Dressing one or multiple wounds ?'[]'$  - 0 ?Large Wound Dressing one or multiple wounds ?X- 1 5 ?Application of Medications - topical ?'[]'$  - 0 ?Application of Medications - injection ?INTERVENTIONS - Miscellaneous ?'[]'$  - External ear exam 0 ?'[]'$  - 0 ?Specimen Collection (cultures, biopsies, blood, body fluids, etc.) ?'[]'$  - 0 ?Specimen(s) / Culture(s) sent or taken to Lab for analysis ?'[]'$  - 0 ?Patient Transfer (multiple staff / Civil Service fast streamer / Similar devices) ?'[]'$  - 0 ?Simple Staple / Suture removal (25 or less) ?'[]'$  - 0 ?Complex Staple / Suture removal (26 or more) ?'[]'$  - 0 ?Hypo / Hyperglycemic Management (close monitor of Blood Glucose) ?'[]'$  - 0 ?Ankle / Brachial Index (ABI) - do not check if billed separately ?X- 1 5 ?Vital Signs ?Has the patient been seen at the hospital within the last three years: Yes ?Total Score: 65 ?Level Of Care: New/Established - Level ?2 ?Electronic Signature(s) ?Signed: 03/07/2022 4:30:09 PM By: Donnamarie Poag ?Entered ByDonnamarie Poag on 03/07/2022 16:12:11 ?Betty Fisher, Betty Fisher ([id number]) ?-------------------------------------------------------------------------------- ?Encounter Discharge Information Details ?Patient Name: Betty Fisher ?Date of Service: 03/07/2022 3:30 PM ?Medical Record Number: [id number] ?Patient Account Number: [id number] ?Date of Birth/Sex: 18-Feb-1996 (26 y.o. F) ?Treating RN: Donnamarie Poag ?Primary Care Migel Hannis: [name] Other Clinician: ?Referring Nhi Butrum: [name] ?Treating Nalda Shackleford/Extender: [name] ?Weeks in Treatment: 4 ?Encounter Discharge Information Items ?Discharge Condition: Stable ?Ambulatory Status: Ambulatory ?Discharge  Destination: Home ?Transportation: Private Auto ?Accompanied By: self ?Schedule Follow-up Appointment: No ?Clinical Summary of Care: ?Electronic Signature(s) ?Signed: 03/07/2022 4:30:09 PM By: Donnamarie Poag ?Entered ByDonnamarie Poag on 03/07/2022 16:23:41 ?Betty Fisher, Betty Fisher ([id number]) ?-------------------------------------------------------------------------------- ?Lower Extremity Assessment Details ?Patient Name: Betty Fisher ?Date of Service: 03/07/2022 3:30 PM ?Medical Record Number: [id number] ?Patient Account Number: [id number] ?Date of Birth/Sex: [birth year] (26 y.o. F) ?Treating RN: Donnamarie Poag ?Primary Care Damein Gaunce: [name] Other Clinician: ?Referring Ja Pistole: [name] ?Treating Melisia Leming/Extender: [name] ?Weeks in Treatment: 4 ?Edema Assessment ?Assessed: [Left: Yes] [Right: No] ?[Left: Edema] [Right: :] ?Calf ?Left: Right: ?Point of Measurement: 30 cm From Medial Instep 52 cm ?Ankle ?Left: Right: ?Point of Measurement: 9 cm From Medial Instep 29 cm ?Electronic Signature(s) ?Signed: 03/07/2022 4:30:09 PM By: Donnamarie Poag ?Entered ByDonnamarie Poag on 03/07/2022 15:58:24 ?Betty Fisher, Betty Fisher ([id number]) ?-------------------------------------------------------------------------------- ?Multi Wound Chart Details ?Patient Name: Betty Fisher ?Date of Service: 03/07/2022 3:30 PM ?Medical Record Number: [id number] ?Patient Account Number: [id number] ?Date of Birth/Sex: 1996-04-01 (26 y.o. F) ?Treating RN: Donnamarie Poag ?Primary Care Nastashia Gallo: [name] Other Clinician: ?Referring Lariah Fleer: [name] ?Treating Arjan Strohm/Extender: [name] ?Weeks in Treatment: 4 ?Vital Signs ?Height(in): 67 ?Pulse(bpm): 92 ?Weight(lbs): 363 ?Blood Pressure(mmHg): 143/96 ?Body Mass Index(BMI): 56.8 ?Temperature(??F): 98.3 ?Respiratory Rate(breaths/min): 16 ?Photos: [N/A:N/A] ?Wound Location: Left, Anterior Lower Leg N/A N/A ?Wounding Event: Surgical Injury N/A N/A ?Primary Etiology: Open Surgical Wound N/A N/A ?Date Acquired:  01/29/2020 N/A N/A ?Weeks of Treatment: 4 N/A N/A ?Wound Status: Open N/A N/A ?Wound Recurrence: No N/A N/A ?Measurements L x W x D (cm) 0.1x0.1x0.1 N/A N/A ?Area (cm?) : 0.008 N/A N/A ?Volume (cm?) : 0.001 N/A

## 2022-03-07 NOTE — Progress Notes (Addendum)
COURNEY, GARROD ([id number]) ?Visit Report for 03/07/2022 ?Chief Complaint Document Details ?Patient Name: Betty Fisher ?Date of Service: 03/07/2022 3:30 PM ?Medical Record Number: [id number] ?Patient Account Number: [id number] ?Date of Birth/Sex: [birth year] (26 y.o. F) ?Treating RN: [name] ?Primary Care Provider: [name] Other Clinician: ?Referring Provider: [name] ?Treating Provider/Extender: [name] ?Weeks in Treatment: 4 ?Information Obtained from: Patient ?Chief Complaint ?Left LE Ulcer ?Electronic Signature(s) ?Signed: 03/07/2022 3:34:17 PM By: Worthy Keeler PA-C ?Entered By: Worthy Keeler on 03/07/2022 15:34:17 ?Betty Fisher, Betty Fisher ([id number]) ?-------------------------------------------------------------------------------- ?HPI Details ?Patient Name: Betty Fisher ?Date of Service: 03/07/2022 3:30 PM ?Medical Record Number: [id number] ?Patient Account Number: [id number] ?Date of Birth/Sex: 1996/07/24 (26 y.o. F) ?Treating RN: [name] ?Primary Care Provider: [name] Other Clinician: ?Referring Provider: [name] ?Treating Provider/Extender: [name] ?Weeks in Treatment: 4 ?History of Present Illness ?HPI Description: 02-06-2022 upon evaluation today patient presents for initial inspection here in our clinic concerning an issue she has been ?having with her leg. This has been going on for quite a while. In fact her incision and drainage surgery was actually performed on September ?2022. Subsequently since that time she has been trying to get this to heal and it would heal sometimes scabbing over and seem to be doing okay ?and then it would reopen again and this is been back and forth. Her leg is also been significantly swollen intermittently today is not too bad there ?is some days when it is much much worse. Actually did review some of the pictures she submitted through epic and it showed a much more ?significantly swollen leg which actually I think may be part of the reason why  this is not wanting to heal as well as it should. Subsequently she is ?also having greenish drainage coming from the wound she tells me she is unsure why. She was on Bactrim for 2 to 3 weeks but has been off of ?that for several days at this point. She did have an ultrasound on December 23, 2021 due to concerns about this worsening and it was negative for ?any signs of abscess. ?The patient does seem to have a history of potential chronic venous insufficiency. Again as we discussed her weight may have some to do with ?this and losing weight could help but at the same time I am also concerned about the fact that she has had an injury here and it may just be ?some irritation in this leg causing more significant swelling than what would otherwise normally be expected. ?02-21-2022 upon evaluation today patient's wound actually showing signs of excellent improvement and actually very pleased with where we stand ?today. There does not appear to be any evidence of infection which is great news and overall I think that we are on the right track here. In fact ?she may be almost completely closed although I still see some area where there is some weeping through where the original wound was. ?02-28-2022 upon evaluation today patient appears to be doing well with regard to her left leg. I definitely see signs of good improvement which is ?great news and overall very pleased with where we stand at this point. I do not see any evidence of active infection locally or systemically which is ?great news and overall I think that we are headed in the right direction here. ?03-07-2022 upon evaluation today patient actually appears to be completely healed which is great news and overall very pleased with where we ?stand today. There does  not appear to be any evidence of active infection at this time. ?Electronic Signature(s) ?Signed: 03/07/2022 4:42:18 PM By: Worthy Keeler PA-C ?Entered By: Worthy Keeler on 03/07/2022 16:42:18 ?Betty Fisher, Betty Fisher ([id number]) ?-------------------------------------------------------------------------------- ?Physical Exam Details ?Patient Name: Betty Fisher ?Date of Service: 03/07/2022 3:30 PM ?Medical Record Number: [id number] ?Patient Account Number: [id number] ?Date of Birth/Sex: 01-20-1996 (26 y.o. F) ?Treating RN: [name] ?Primary Care Provider: [name] Other Clinician: ?Referring Provider: [name] ?Treating Provider/Extender: [name] ?Weeks in Treatment: 4 ?Constitutional ?Well-nourished and well-hydrated in no acute distress. ?Respiratory ?normal breathing without difficulty. ?Psychiatric ?this patient is able to make decisions and demonstrates good insight into disease process. Alert and Oriented x 3. pleasant and cooperative. ?Notes ?Patient's wound bed showed evidence of good granulation and epithelization at this point. Overall I do believe that we are headed in the right ?direction here and I think that the patient is doing quite well. ?Electronic Signature(s) ?Signed: 03/07/2022 4:42:28 PM By: Worthy Keeler PA-C ?Entered By: Worthy Keeler on 03/07/2022 16:42:28 ?Betty Fisher, Betty Fisher ([id number]) ?-------------------------------------------------------------------------------- ?Physician Orders Details ?Patient Name: Betty Fisher ?Date of Service: 03/07/2022 3:30 PM ?Medical Record Number: [id number] ?Patient Account Number: [id number] ?Date of Birth/Sex: 13-Aug-1996 (26 y.o. 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 - E ?o Patient to wear own compression stockings. Remove compression stockings every night before going to bed and put on ?every morning when getting up. - recommended ?o Patient to wear own Velcro compression garment. Remove compression stockings every night before going to bed and put ?on every morning when getting up. - Basic Farrow Wrap recommended-available on [employer] ?o Elevate legs to the level of the heart and pump ankles as often as possible ?o Elevate leg(s) parallel to the floor when sitting. ?Non-Wound Condition ?o Cleanse affected area with antibacterial soap and water, ?o Additional non-wound orders/instructions: - Protect newly healed skin and cover; keep [facility] appt 5/23/823 ?Wound Treatment ?Electronic Signature(s) ?Signed: 03/07/2022 4:30:09 PM By: Donnamarie Poag ?Signed: 03/07/2022 5:21:07 PM By: Worthy Keeler PA-C ?Entered ByDonnamarie Poag on 03/07/2022 16:16:41 ?Betty Fisher, Betty Fisher ([id number]) ?-------------------------------------------------------------------------------- ?Problem List Details ?Patient Name: Betty Fisher ?Date of Service: 03/07/2022 3:30 PM ?Medical Record Number: [id number] ?Patient Account Number: [id number] ?Date of Birth/Sex: Oct 04, 1996 (26 y.o. F) ?Treating RN: [name] ?Primary Care Provider: [name] Other Clinician: ?Referring Provider: [name] ?Treating Provider/Extender: [name] ?Weeks in Treatment: 4 ?Active Problems ?ICD-10 ?Encounter ?Code Description Active Date MDM ?Diagnosis ?S06.301 Chronic venous hypertension (idiopathic) with ulcer of left lower 02/21/2022 No Yes ?extremity ?S01.093 Non-pressure chronic ulcer of other part of left lower leg with fat layer 02/06/2022 No Yes ?exposed ?E66.01 Morbid (severe) obesity due to excess calories 02/06/2022 No Yes ?Inactive  Problems ?Resolved Problems ?Electronic Signature(s) ?Signed: 03/07/2022 3:33:58 PM By: Worthy Keeler PA-C ?Entered By: Worthy Keeler on 03/07/2022 15:33:58 ?Betty Fisher, Betty Fisher ([id number]) ?----------------------------------------

## 2022-03-07 NOTE — Telephone Encounter (Signed)
Patient called for printed TB test from last year. Desha will print and put at front desk for patient to pick upToni ?

## 2022-03-21 ENCOUNTER — Telehealth (INDEPENDENT_AMBULATORY_CARE_PROVIDER_SITE_OTHER): Payer: Self-pay

## 2022-03-21 ENCOUNTER — Ambulatory Visit (INDEPENDENT_AMBULATORY_CARE_PROVIDER_SITE_OTHER): Payer: 59 | Admitting: Vascular Surgery

## 2022-03-21 ENCOUNTER — Encounter (INDEPENDENT_AMBULATORY_CARE_PROVIDER_SITE_OTHER): Payer: Self-pay | Admitting: Vascular Surgery

## 2022-03-21 VITALS — BP 153/92 | HR 93 | Resp 16 | Wt 391.2 lb

## 2022-03-21 DIAGNOSIS — M7989 Other specified soft tissue disorders: Secondary | ICD-10-CM | POA: Diagnosis not present

## 2022-03-21 DIAGNOSIS — T792XXA Traumatic secondary and recurrent hemorrhage and seroma, initial encounter: Secondary | ICD-10-CM

## 2022-03-21 DIAGNOSIS — M79662 Pain in left lower leg: Secondary | ICD-10-CM

## 2022-03-21 DIAGNOSIS — Z6841 Body Mass Index (BMI) 40.0 and over, adult: Secondary | ICD-10-CM

## 2022-03-21 NOTE — Assessment & Plan Note (Signed)
Status post drainage in the past with recurrent wound issues.  Has recently seen the wound care center and got the wound almost completely healed.  Swelling is still persistent and severe.

## 2022-03-21 NOTE — Telephone Encounter (Signed)
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 I spoke with Dr Lucky Cowboy and he is fine with her going on the field trip.patient was made aware and verbalized understanding.

## 2022-03-21 NOTE — Patient Instructions (Signed)
Lymphedema ? ?Lymphedema is swelling that is caused by the abnormal collection of lymph in the tissues under the skin. Lymph is excess fluid from the tissues in your body that is removed through the lymphatic system. This system is part of your body's defense system (immune system) and includes lymph nodes and lymph vessels. The lymph vessels collect and carry the excess fluid, fats, proteins, and waste from the tissues of the body to the bloodstream. This system also works to clean and remove bacteria and waste products from the body. ?Lymphedema occurs when the lymphatic system is blocked. When the lymph vessels or lymph nodes are blocked or damaged, lymph does not drain properly. This causes an abnormal buildup of lymph, which leads to swelling in the affected area. This may include the trunk area, or an arm or leg. Lymphedema cannot be cured by medicines, but various methods can be used to help reduce the swelling. ?What are the causes? ?The cause of this condition depends on the type of lymphedema that you have. ?Primary lymphedema is caused by the absence of lymph vessels or having abnormal lymph vessels at birth. ?Secondary lymphedema occurs when lymph vessels are blocked or damaged. Secondary lymphedema is more common. Common causes of lymph vessel blockage include: ?Skin infection, such as cellulitis. ?Infection by parasites (filariasis). ?Injury. ?Radiation therapy. ?Cancer. ?Formation of scar tissue. ?Surgery. ?What are the signs or symptoms? ?Symptoms of this condition include: ?Swelling of the arm or leg. ?A heavy or tight feeling in the arm or leg. ?Swelling of the feet, toes, or fingers. Shoes or rings may fit more tightly than before. ?Redness of the skin over the affected area. ?Limited movement of the affected limb. ?Sensitivity to touch or discomfort in the affected limb. ?How is this diagnosed? ?This condition may be diagnosed based on: ?Your symptoms and medical history. ?A physical  exam. ?Bioimpedance spectroscopy. In this test, painless electrical currents are used to measure fluid levels in your body. ?Imaging tests, such as: ?MRI. ?CT scan. ?Duplex ultrasound. This test uses sound waves to produce images of the vessels and the blood flow on a screen. ?Lymphoscintigraphy. In this test, a low dose of a radioactive substance is injected to trace the flow of lymph through your lymph vessels. ?Lymphangiography. In this test, a contrast dye is injected into the lymph vessel to help show blockages. ?How is this treated? ? ?If an underlying condition is causing the lymphedema, that condition will be treated. For example, antibiotic medicines may be used to treat an infection. ?Treatment for this condition will depend on the cause of your lymphedema. Treatment may include: ?Complete decongestive therapy (CDT). This is done by a certified lymphedema therapist to reduce fluid congestion. This therapy includes: ?Skin care. ?Compression wrapping of the affected area. ?Manual lymph drainage. This is a special massage technique that promotes lymph drainage out of a limb. ?Specific exercises. Certain exercises can help fluid move out of the affected limb. ?Compression. Various methods may be used to apply pressure to the affected limb to reduce the swelling. They include: ?Wearing compression stockings or sleeves on the affected limb. ?Wrapping the affected limb with special bandages. ?Surgery. This is usually done for severe cases only. For example, surgery may be done if you have trouble moving the limb or if the swelling does not get better with other treatments. ?Follow these instructions at home: ?Self-care ?The affected area is more likely to become injured or infected. Take these steps to help prevent infection: ?Keep the affected   area clean and dry. ?Use approved creams or lotions to keep the skin moisturized. ?Protect your skin from cuts: ?Use gloves while cooking or gardening. ?Do not walk  barefoot. ?If you shave the affected area, use an electric razor. ?Do not wear tight clothes, shoes, or jewelry. ?Eat a healthy diet that includes a lot of fruits and vegetables. ?Activity ?Do exercises as told by your health care provider. ?Do not sit with your legs crossed. ?When possible, keep the affected limb raised (elevated) above the level of your heart. ?Avoid carrying things with an arm that is affected by lymphedema. ?General instructions ?Wear compression stockings or sleeves as told by your health care provider. ?Note any changes in size of the affected limb. You may be instructed to take regular measurements and keep track of them. ?Take over-the-counter and prescription medicines only as told by your health care provider. ?If you were prescribed an antibiotic medicine, take or apply it as told by your health care provider. Do not stop using the antibiotic even if you start to feel better or if your condition improves. ?Do not use heating pads or ice packs on the affected area. ?Avoid having blood draws, IV insertions, or blood pressure checks on the affected limb. ?Keep all follow-up visits. This is important. ?Contact a health care provider if you: ?Continue to have swelling in your limb. ?Have fluid leaking from the skin of your swollen limb. ?Have a cut that does not heal. ?Have redness or pain in the affected area. ?Develop purplish spots, rash, blisters, or sores (lesions) on your affected limb. ?Get help right away if you: ?Have new swelling in your limb that starts suddenly. ?Have shortness of breath or chest pain. ?Have a fever or chills. ?These symptoms may represent a serious problem that is an emergency. Do not wait to see if the symptoms will go away. Get medical help right away. Call your local emergency services (911 in the [place] Do not drive yourself to the hospital. ?Summary ?Lymphedema is swelling that is caused by the abnormal collection of lymph in the tissues under the  skin. ?Lymph is fluid from the tissues in your body that is removed through the lymphatic system. This system collects and carries excess fluid, fats, proteins, and wastes from the tissues of the body to the bloodstream. ?Lymphedema causes swelling, pain, and redness in the affected area. This may include the trunk area, or an arm or leg. ?Treatment for this condition may depend on the cause of your lymphedema. Treatment may include treating the underlying cause, complete decongestive therapy (CDT), compression methods, or surgery. ?This information is not intended to replace advice given to you by your health care provider. Make sure you discuss any questions you have with your health care provider. ?Document Revised: 08/11/2020 Document Reviewed: 08/11/2020 ?Elsevier Patient Education ? 2023 [employer] ? ?

## 2022-03-21 NOTE — Progress Notes (Signed)
MRN : [id number]  Betty Fisher is a 26 y.o. (1996/05/04) female who presents with chief complaint of  Chief Complaint  Patient presents with   Follow-up    Wound check  .  History of Present Illness: Patient returns today in follow up today on referral from [name] at the wound care center.  She was previously seen last year by my partner and had a posttraumatic seroma of the left lower extremity drained which ultimately recurred with infection and was treated at an outside center with up to 2 weeks in the hospital and what sounds like multiple procedures.  This did reasonably well but did not heal completely and ultimately a month or so ago she began seeing the wound care center.  There was infection in the wound, and this was treated with antibiotics and excellent local wound care and the skin is almost completely healed at this point.  The swelling in the left lower extremity is still persistent and significant.  She has previously been told she had venous insufficiency in that left leg.  She has had multiple episodes of infection and longstanding wound issues in that left leg.  This originally started about 2 years ago with a traumatic injury to that left leg.  She has intermittently had fevers but none recently.  No chest pain or shortness of breath.  Current Outpatient Medications  Medication Sig Dispense Refill   aspirin 81 MG EC tablet Take 81 mg by mouth daily. Swallow whole.     ibuprofen (ADVIL) 800 MG tablet Take 1 tablet (800 mg total) by mouth every 8 (eight) hours as needed. (Patient taking differently: Take 800 mg by mouth every 8 (eight) hours as needed for cramping (menstrual pain relief).) 30 tablet 3   levothyroxine (SYNTHROID) 75 MCG tablet Take 1 tablet (75 mcg total) by mouth daily. (Patient taking differently: Take 75 mcg by mouth daily before breakfast.) 90 tablet 3   No current facility-administered medications for this visit.    Past Medical History:   Diagnosis Date   Vitamin D deficiency     Past Surgical History:  Procedure Laterality Date   INCISION AND DRAINAGE ABSCESS Left 05/25/2021   Procedure: INCISION AND DRAINAGE SEROMA;  Surgeon: Katha Cabal, MD;  Location: [facility] ORS;  Service: Vascular;  Laterality: Left;   WISDOM TOOTH EXTRACTION       Social History   Tobacco Use   Smoking status: Never   Smokeless tobacco: Never  Vaping Use   Vaping Use: Never used  Substance Use Topics   Alcohol use: Yes    Alcohol/week: 1.0 standard drink    Types: 1 Glasses of wine per week    Comment: occationally   Drug use: No     Family History  Problem Relation Age of Onset   Hypertension Mother    Hypothyroidism Mother    Hypertension Father   No bleeding or clotting disorders  No Known Allergies   REVIEW OF SYSTEMS (Negative unless checked)  Constitutional: '[]'$ Weight loss  '[]'$ Fever  '[]'$ Chills Cardiac: '[]'$ Chest pain   '[]'$ Chest pressure   '[]'$ Palpitations   '[]'$ Shortness of breath when laying flat   '[]'$ Shortness of breath at rest   '[]'$ Shortness of breath with exertion. Vascular:  '[]'$ Pain in legs with walking   '[]'$ Pain in legs at rest   '[]'$ Pain in legs when laying flat   '[]'$ Claudication   '[]'$ Pain in feet when walking  '[]'$ Pain in feet at rest  '[]'$ Pain in feet when  laying flat   '[]'$ History of DVT   '[]'$ Phlebitis   '[x]'$ Swelling in legs   '[]'$ Varicose veins   '[x]'$ Non-healing ulcers Pulmonary:   '[]'$ Uses home oxygen   '[]'$ Productive cough   '[]'$ Hemoptysis   '[]'$ Wheeze  '[]'$ COPD   '[]'$ Asthma Neurologic:  '[]'$ Dizziness  '[]'$ Blackouts   '[]'$ Seizures   '[]'$ History of stroke   '[]'$ History of TIA  '[]'$ Aphasia   '[]'$ Temporary blindness   '[]'$ Dysphagia   '[]'$ Weakness or numbness in arms   '[]'$ Weakness or numbness in legs Musculoskeletal:  '[]'$ Arthritis   '[]'$ Joint swelling   '[]'$ Joint pain   '[]'$ Low back pain Hematologic:  '[]'$ Easy bruising  '[]'$ Easy bleeding   '[]'$ Hypercoagulable state   '[]'$ Anemic   Gastrointestinal:  '[]'$ Blood in stool   '[]'$ Vomiting blood  '[]'$ Gastroesophageal reflux/heartburn   '[]'$ Abdominal  pain Genitourinary:  '[]'$ Chronic kidney disease   '[]'$ Difficult urination  '[]'$ Frequent urination  '[]'$ Burning with urination   '[]'$ Hematuria Skin:  '[]'$ Rashes   '[x]'$ Ulcers   '[x]'$ Wounds Psychological:  '[]'$ History of anxiety   '[]'$  History of major depression.  Physical Examination  Resp 16   Wt (!) 391 lb 3.2 oz (177.4 kg)   BMI 61.27 kg/m  Gen:  WD/WN, NAD. obese Head: [place]/AT, No temporalis wasting. Ear/Nose/Throat: Hearing grossly intact, nares w/o erythema or drainage Eyes: Conjunctiva clear. Sclera non-icteric Neck: Supple.  Trachea midline Pulmonary:  Good air movement, no use of accessory muscles.  Cardiac: RRR, no JVD Vascular:  Vessel Right Left  Radial Palpable Palpable               Musculoskeletal: M/S 5/5 throughout.  No deformity or atrophy.  No significant right lower extremity edema.  Small almost completely healed wound in the pretibial area a few centimeters above the left ankle.  Her left lower extremity has significant 2-3+ edema with a typical appearance of lymphedema largely sparing the foot and ankle. Neurologic: Sensation grossly intact in extremities.  Symmetrical.  Speech is fluent.  Psychiatric: Judgment intact, Mood & affect appropriate for pt's clinical situation. Dermatologic: No rashes or ulcers noted.  No cellulitis or open wounds.      Labs Recent Results (from the past 2160 hour(s))  Aerobic Culture w Gram Stain (superficial specimen)     Status: None   Collection Time: 02/06/22  1:31 PM   Specimen: Wound  Result Value Ref Range   Specimen Description      WOUND Performed at [facility], [address], [place], [place] [address]    Special Requests      LEFT LOWER LEG Performed at [facility], [address] [place], [place] [address]    Gram Stain      NO WBC SEEN NO ORGANISMS SEEN Performed at [facility], [place] [address] [place], [place] [address]    Culture RARE PSEUDOMONAS AERUGINOSA    Report Status 02/09/2022  FINAL    Organism ID, Bacteria PSEUDOMONAS AERUGINOSA       Susceptibility   Pseudomonas aeruginosa - MIC*    CEFTAZIDIME 2 SENSITIVE Sensitive     CIPROFLOXACIN <=0.25 SENSITIVE Sensitive     GENTAMICIN <=1 SENSITIVE Sensitive     IMIPENEM 2 SENSITIVE Sensitive     PIP/TAZO <=4 SENSITIVE Sensitive     CEFEPIME 2 SENSITIVE Sensitive     * RARE PSEUDOMONAS AERUGINOSA    Radiology No results found.  Assessment/Plan  Posttraumatic seroma (HCC) Status post drainage in the past with recurrent wound issues.  Has recently seen the wound care center and got the wound almost  completely healed.  Swelling is still persistent and severe.  Pain and swelling of lower leg The patient has prominent swelling of the left lower extremity that is likely multifactorial in nature.  Given her previous trauma, surgeries, and infection she clearly has a component of lymphedema this would be at least stage II lymphedema with chronic scarring and lymphatic channels refractory to swelling and elevation.  There is also been suggestion in the past of venous insufficiency so a venous reflux study will be done in the near future at her convenience to evaluate for significant venous disease that would benefit from treatment.  I have discussed the pathophysiology and natural history of both venous disease and lymphedema with the patient today.  She will return with her duplex in the near future.  Morbid obesity with body mass index (BMI) of 50.0 to 59.9 in adult [facility]) Weight loss will be of benefit in reducing leg swelling.    Leotis Pain, MD  03/21/2022 2:55 PM    This note was created with Dragon medical transcription system.  Any errors from dictation are purely unintentional

## 2022-03-21 NOTE — Assessment & Plan Note (Signed)
The patient has prominent swelling of the left lower extremity that is likely multifactorial in nature.  Given her previous trauma, surgeries, and infection she clearly has a component of lymphedema this would be at least stage II lymphedema with chronic scarring and lymphatic channels refractory to swelling and elevation.  There is also been suggestion in the past of venous insufficiency so a venous reflux study will be done in the near future at her convenience to evaluate for significant venous disease that would benefit from treatment.  I have discussed the pathophysiology and natural history of both venous disease and lymphedema with the patient today.  She will return with her duplex in the near future.

## 2022-03-21 NOTE — Assessment & Plan Note (Signed)
Weight loss will be of benefit in reducing leg swelling.

## 2022-03-22 ENCOUNTER — Encounter: Payer: Commercial Managed Care - HMO | Admitting: Nurse Practitioner

## 2022-04-06 ENCOUNTER — Encounter: Payer: 59 | Attending: Physician Assistant | Admitting: Physician Assistant

## 2022-04-06 ENCOUNTER — Ambulatory Visit: Payer: 59 | Admitting: Physician Assistant

## 2022-04-06 DIAGNOSIS — Z6841 Body Mass Index (BMI) 40.0 and over, adult: Secondary | ICD-10-CM | POA: Diagnosis not present

## 2022-04-06 DIAGNOSIS — L97822 Non-pressure chronic ulcer of other part of left lower leg with fat layer exposed: Secondary | ICD-10-CM | POA: Insufficient documentation

## 2022-04-06 NOTE — Progress Notes (Addendum)
Betty, Fisher ([id number]) Visit Report for 04/06/2022 Chief Complaint Document Details Patient Name: Betty Fisher, Betty Fisher. Date of Service: 04/06/2022 12:30 PM Medical Record Number: [id number] Patient Account Number: [id number] Date of Birth/Sex: 1996/06/14 (26 y.o. F) Treating RN: [name] Primary Care Provider: [name] Other Clinician: Referring Provider: [name] Treating Provider/Extender: Skipper Cliche in Treatment: 0 Information Obtained from: Patient Chief Complaint Left LE Ulcer Electronic Signature(s) Signed: 04/06/2022 1:05:12 PM By: Worthy Keeler PA-C Previous Signature: 04/06/2022 1:01:34 PM Version By: Worthy Keeler PA-C Entered By: Worthy Keeler on 04/06/2022 13:05:12 Betty Fisher ([id number]) -------------------------------------------------------------------------------- Debridement Details Patient Name: Betty Fisher Date of Service: 04/06/2022 12:30 PM Medical Record Number: [id number] Patient Account Number: [id number] Date of Birth/Sex: 1996/05/08 (26 y.o. F) Treating RN: [name] Primary Care Provider: [name] Other Clinician: Referring Provider: [name] Treating Provider/Extender: Skipper Cliche in Treatment: 0 Debridement Performed for Wound #2 Left,Anterior Lower Leg Assessment: Performed By: Physician Tommie Sams., PA-C Debridement Type: Chemical/Enzymatic/Mechanical Agent Used: saline gauze Level of Consciousness (Pre- Awake and Alert procedure): Pre-procedure Verification/Time Out Yes - 13:07 Taken: Start Time: 13:07 Instrument: Other : saline gauze Bleeding: Minimum Hemostasis Achieved: Pressure End Time: 13:09 Procedural Pain: 8 Post Procedural Pain: 8 Response to Treatment: Procedure was tolerated well Level of Consciousness (Post- Awake and Alert procedure): Post Debridement Measurements of Total Wound Length: (cm) 2 Width: (cm) 0.7 Depth: (cm) 0.1 Volume: (cm) 0.11 Character of Wound/Ulcer Post  Debridement: Improved Post Procedure Diagnosis Same as Pre-procedure Electronic Signature(s) Signed: 04/06/2022 4:11:43 PM By: Worthy Keeler PA-C Signed: 04/07/2022 9:45:44 AM By: [name] RN Entered By: [name] on 04/06/2022 13:08:20 Betty Fisher ([id number]) -------------------------------------------------------------------------------- HPI Details Patient Name: Betty Fisher Date of Service: 04/06/2022 12:30 PM Medical Record Number: [id number] Patient Account Number: [id number] Date of Birth/Sex: 06-Nov-1995 (26 y.o. F) Treating RN: [name] Primary Care Provider: [name] Other Clinician: Referring Provider: [name] Treating Provider/Extender: Skipper Cliche in Treatment: 0 History of Present Illness HPI Description: 02-06-2022 upon evaluation today patient presents for initial inspection here in our clinic concerning an issue she has been having with her leg. This has been going on for quite a while. In fact her incision and drainage surgery was actually performed on September 2022. Subsequently since that time she has been trying to get this to heal and it would heal sometimes scabbing over and seem to be doing okay and then it would reopen again and this is been back and forth. Her leg is also been significantly swollen intermittently today is not too bad there is some days when it is much much worse. Actually did review some of the pictures she submitted through epic and it showed a much more significantly swollen leg which actually I think may be part of the reason why this is not wanting to heal as well as it should. Subsequently she is also having greenish drainage coming from the wound she tells me she is unsure why. She was on Bactrim for 2 to 3 weeks but has been off of that for several days at this point. She did have an ultrasound on December 23, 2021 due to concerns about this worsening and it was negative for any signs of abscess. The patient does  seem to have a history of potential chronic venous insufficiency. Again as we discussed her weight may have some to do with this and losing weight could help but at the same time  I am also concerned about the fact that she has had an injury here and it may just be some irritation in this leg causing more significant swelling than what would otherwise normally be expected. 02-21-2022 upon evaluation today patient's wound actually showing signs of excellent improvement and actually very pleased with where we stand today. There does not appear to be any evidence of infection which is great news and overall I think that we are on the right track here. In fact she may be almost completely closed although I still see some area where there is some weeping through where the original wound was. 02-28-2022 upon evaluation today patient appears to be doing well with regard to her left leg. I definitely see signs of good improvement which is great news and overall very pleased with where we stand at this point. I do not see any evidence of active infection locally or systemically which is great news and overall I think that we are headed in the right direction here. 03-07-2022 upon evaluation today patient actually appears to be completely healed which is great news and overall very pleased with where we stand today. There does not appear to be any evidence of active infection at this time. Readmission: 04-06-2022 patient presents for reevaluation here in the clinic unfortunately she has been having issues since she returned from the beach with infection seemingly over the left lower extremity on the anterior portion where previously she had a wound that we completely healed. Since I saw her last she has not been wearing compression despite the fact that we had had a conversation about this including even given her information to look on [place] for the Farrow wrap basic in order to help since I was not sure she would be  able to use a standard compression stocking. Nonetheless the patient told [employer] when she checked and that nobody discussed compression with her. When I went in to see her however she admitted to having [place] following our previous discussion but states that "there were so many options that I did not know what to order". Electronic Signature(s) Signed: 04/06/2022 1:57:36 PM By: Worthy Keeler PA-C Entered By: Worthy Keeler on 04/06/2022 13:57:36 Betty Fisher ([id number]) -------------------------------------------------------------------------------- Physical Exam Details Patient Name: Betty Fisher Date of Service: 04/06/2022 12:30 PM Medical Record Number: [id number] Patient Account Number: [id number] Date of Birth/Sex: Aug 27, 1996 (26 y.o. F) Treating RN: [name] Primary Care Provider: [name] Other Clinician: Referring Provider: [name] Treating Provider/Extender: [name] Weeks in Treatment: 0 Constitutional Obese and well-hydrated in no acute distress. Respiratory normal breathing without difficulty. Psychiatric this patient is able to make decisions and demonstrates good insight into disease process. Alert and Oriented x 3. pleasant and cooperative. Notes Upon inspection patient's wound bed actually showed signs of some purulent drainage noted at this point. Fortunately I do not see any evidence of systemic infection though locally I am concerned here. No debridement was necessary today its really not open terribly although there is a wound opening she has a lot of swelling again she has not been wearing compression so this does not surprise me. She previously did well with the Levaquin which I think is a good option to go with this time as well. Electronic Signature(s) Signed: 04/06/2022 2:00:52 PM By: Worthy Keeler PA-C Entered By: Worthy Keeler on 04/06/2022 14:00:52 Betty Fisher  ([id number]) -------------------------------------------------------------------------------- Physician Orders Details Patient Name: Betty Fisher Date of Service: 04/06/2022  12:30 PM Medical Record Number: [id number] Patient Account Number: [id number] Date of Birth/Sex: Sep 01, 1996 (26 y.o. 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 Bathing/ Shower/ Hygiene o May shower; gently cleanse wound with antibacterial soap, rinse and pat dry prior to dressing wounds Edema Control - Lymphedema / Segmental Compressive Device / Other o Optional: One layer of unna paste to top of compression wrap (to act as an anchor). o Elevate, Exercise Daily and Avoid Standing for Long Periods of Time. o Elevate legs to the level of the heart and pump ankles as often as possible o Elevate leg(s) parallel to the floor when sitting. Wound Treatment Wound #2 - Lower Leg Wound Laterality: Left, Anterior Cleanser: Byram Ancillary Kit - 15 Day Supply (DME) (Generic) 3 x Per Week/30 Days Discharge Instructions: Use supplies as instructed; Kit contains: (15) Saline Bullets; (15) 3x3 Gauze; 15 pr Gloves Cleanser: Soap and Water 3 x Per Week/30 Days Discharge Instructions: Gently cleanse wound with antibacterial soap, rinse and pat dry prior to dressing wounds Primary Dressing: Silvercel Small 2x2 (in/in) (DME) (Generic) 3 x Per Week/30 Days Discharge Instructions: Apply Silvercel Small 2x2 (in/in) as instructed Secondary  Dressing: ABD Pad 5x9 (in/in) (DME) (Generic) 3 x Per Week/30 Days Discharge Instructions: Cover with ABD pad Secured With: tubi grip E 3 x Per Week/30 Days Discharge Instructions: double layer Compression Stockings: Jobst Farrow Wrap Basic (DME) Left Leg Compression Amount: 30-40 mmHG Discharge Instructions: patient to wear daily on in the am off inthe pm Patient Medications Allergies: No Known Drug Allergies Notifications Medication Indication Start End levofloxacin 04/06/2022 DOSE 1 - oral 500 mg tablet - 1 tablet oral taken 1 time per day for 14 days Electronic Signature(s) Signed: 04/06/2022 2:04:18 PM By: Worthy Keeler PA-C Entered By: Worthy Keeler on 04/06/2022 14:04:17 Betty Fisher ([id number] Fisher ([id number]) -------------------------------------------------------------------------------- Problem List Details Patient Name: Betty Fisher Date of Service: 04/06/2022 12:30 PM Medical Record Number: [id number] Patient Account Number: [id number] Date of Birth/Sex: [birth year] (26 y.o. F) Treating RN: [name] Primary Care Provider: [name] Other Clinician: Referring Provider: [name] Treating Provider/Extender: Skipper Cliche in Treatment: 0 Active Problems ICD-10 Encounter Code Description Active Date MDM Diagnosis I87.312 Chronic venous hypertension (idiopathic) with ulcer of left lower 04/06/2022 No Yes extremity L97.822 Non-pressure chronic ulcer of other part of left lower leg with fat layer 04/06/2022 No Yes exposed E66.01 Morbid (severe) obesity due to excess calories 04/06/2022 No Yes Inactive Problems Resolved Problems Electronic Signature(s) Signed: 04/06/2022 1:05:02 PM By: Worthy Keeler PA-C Previous Signature: 04/06/2022 1:01:28 PM Version By: Worthy Keeler PA-C Entered By: Worthy Keeler on 04/06/2022 13:05:02 Betty Fisher ([id number]) -------------------------------------------------------------------------------- Progress  Note Details Patient Name: Betty Fisher Date of Service: 04/06/2022 12:30 PM Medical Record Number: [id number] Patient Account Number: [id number] Date of Birth/Sex: 03/28/1996 (26 y.o. F) Treating RN: [name] Primary Care Provider: [name] Other Clinician: Referring Provider: [name] Treating Provider/Extender: Skipper Cliche in Treatment: 0 Subjective Chief Complaint Information obtained from Patient Left LE Ulcer History of Present Illness (HPI)  02-06-2022 upon evaluation today patient presents for initial inspection here in our clinic concerning an issue she has been having with her leg. This has been going on for quite a while. In fact her incision and drainage surgery was actually performed on September 2022. Subsequently since that time she has been trying to get this to heal and it would heal sometimes scabbing over and seem to be doing okay and then it would reopen again and this is been back and forth. Her leg is also been significantly swollen intermittently today is not too bad there is some days when it is much much worse. Actually did review some of the pictures she submitted through epic and it showed a much more significantly swollen leg which actually I think may be part of the reason why this is not wanting to heal as well as it should. Subsequently she is also having greenish drainage coming from the wound she tells me she is unsure why. She was on Bactrim for 2 to 3 weeks but has been off of that for several days at this point. She did have an ultrasound on December 23, 2021 due to concerns about this worsening and it was negative for any signs of abscess. The patient does seem to have a history of potential chronic venous insufficiency. Again as we discussed her weight may have some to do with this and losing weight could help but at the same time I am also concerned about the fact that she has had an injury here and it may just be some irritation in this  leg causing more significant swelling than what would otherwise normally be expected. 02-21-2022 upon evaluation today patient's wound actually showing signs of excellent improvement and actually very pleased with where we stand today. There does not appear to be any evidence of infection which is great news and overall I think that we are on the right track here. In fact she may be almost completely closed although I still see some area where there is some weeping through where the original wound was. 02-28-2022 upon evaluation today patient appears to be doing well with regard to her left leg. I definitely see signs of good improvement which is great news and overall very pleased with where we stand at this point. I do not see any evidence of active infection locally or systemically which is great news and overall I think that we are headed in the right direction here. 03-07-2022 upon evaluation today patient actually appears to be completely healed which is great news and overall very pleased with where we stand today. There does not appear to be any evidence of active infection at this time. Readmission: 04-06-2022 patient presents for reevaluation here in the clinic unfortunately she has been having issues since she returned from the beach with infection seemingly over the left lower extremity on the anterior portion where previously she had a wound that we completely healed. Since I saw her last she has not been wearing compression despite the fact that we had had a conversation about this including even given her information to look on [place] for the Farrow wrap basic in order to help since I was not sure she would be able to use a standard compression stocking. Nonetheless the patient told [employer] when she checked and that nobody discussed compression with her. When I went in to see her however she admitted to having [place] following our previous discussion but states that "there were so  many options that I did not know what to order". Objective Constitutional Obese and well-hydrated in no acute distress. Vitals Time Taken: 12:46 PM, Temperature: 98.2 F, Pulse: 84 bpm, Respiratory Rate: 18 breaths/min, Blood Pressure: 153/78 mmHg. Respiratory normal breathing without difficulty. Psychiatric this patient is able to make decisions and demonstrates good insight into disease process. Alert and Oriented x 3. pleasant and cooperative. General Notes: Upon inspection patient's wound bed actually showed signs of some purulent drainage noted at this point. Fortunately I do not see BIVIANA, SADDLER. ([id number]) any evidence of systemic infection though locally I am concerned here. No debridement was necessary today its really not open terribly although there is a wound opening she has a lot of swelling again she has not been wearing compression so this does not surprise me. She previously did well with the Levaquin which I think is a good option to go with this time as well. Integumentary (Hair, Skin) Wound #2 status is Open. Original cause of wound was Trauma. The date acquired was: 01/28/2022. The wound is located on the Left,Anterior Lower Leg. The wound measures 2cm length x 0.7cm width x 0.1cm depth; 1.1cm^2 area and 0.11cm^3 volume. There is Fat Layer (Subcutaneous Tissue) exposed. There is no tunneling or undermining noted. There is a medium amount of serosanguineous drainage noted. There is medium (34-66%) red, pink granulation within the wound bed. There is a medium (34-66%) amount of necrotic tissue within the wound bed including Adherent Slough. 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 There was a Chemical/Enzymatic/Mechanical debridement performed by Tommie Sams., PA-C. With the following instrument(s): saline gauze. Other agent used was saline gauze. A time out was conducted at 13:07, prior to the start of the procedure. A Minimum amount of bleeding was controlled with Pressure. The procedure was tolerated well with a pain level of 8 throughout and a pain level of 8 following the procedure. Post Debridement Measurements: 2cm length x 0.7cm width x 0.1cm depth; 0.11cm^3 volume. Character of Wound/Ulcer Post Debridement is improved. Post procedure Diagnosis Wound #2: Same as Pre-Procedure Plan Follow-up Appointments: Return Appointment in 1 week. Bathing/ Shower/ Hygiene: May shower; gently cleanse wound with antibacterial soap, rinse and pat dry prior to dressing wounds Edema Control - Lymphedema / Segmental Compressive Device / Other: Optional: One layer of unna paste to top of compression wrap (to act as an anchor). Elevate, Exercise Daily and Avoid Standing for Long Periods of Time. Elevate legs to the level of the heart and pump ankles as often as possible Elevate leg(s) parallel to the floor when sitting. The following medication(s) was prescribed: levofloxacin oral 500 mg tablet 1 1 tablet oral taken 1 time per day for 14 days starting 04/06/2022 WOUND #2: - Lower Leg Wound Laterality: Left, Anterior Cleanser: Byram Ancillary Kit - 15 Day Supply (DME) (Generic) 3 x Per Week/30 Days Discharge Instructions: Use supplies as instructed; Kit contains: (15) Saline Bullets; (15) 3x3 Gauze; 15 pr Gloves Cleanser: Soap and Water 3 x Per Week/30 Days Discharge Instructions: Gently cleanse wound with antibacterial soap, rinse and pat dry prior to dressing wounds Primary Dressing: Silvercel Small 2x2 (in/in) (DME) (Generic) 3 x Per Week/30 Days Discharge Instructions: Apply Silvercel Small  2x2 (in/in) as instructed Secondary Dressing: ABD Pad 5x9  (in/in) (DME) (Generic) 3 x Per Week/30 Days Discharge Instructions: Cover with ABD pad Secured With: tubi grip E 3 x Per Week/30 Days Discharge Instructions: double layer Compression Stockings: Jobst Farrow Wrap Basic (DME) Compression Amount: 30-40 mmHg (left) Discharge Instructions: patient to wear daily on in the am off inthe pm JERRIE, SCHUSSLER N. ([id number]) 1. I am can recommend that we go ahead and initiate treatment with silver cell think this is good to be a good option. 2. Also can recommend ABD be to cover. 3. Tubigrip double layer size E will be used to secure everything in place. 4. I am also going to go ahead and see about ordering a Velcro compression wrap which I do believe is necessary she needs something to keep this under control and I think that is probably good to be her best option to be honest. 5. Also, going to send in a prescription for the Levaquin which has done well in the past. Completely well at this time as well. We will see patient back for reevaluation in 1 week here in the clinic. If anything worsens or changes patient will contact our office for additional recommendations. Electronic Signature(s) Signed: 04/06/2022 2:04:33 PM By: Worthy Keeler PA-C Entered By: Worthy Keeler on 04/06/2022 14:04:33 Betty Fisher ([id number]) -------------------------------------------------------------------------------- SuperBill Details Patient Name: Betty Fisher Date of Service: 04/06/2022 Medical Record Number: [id number] Patient Account Number: [id number] Date of Birth/Sex: 1995/12/02 (26 y.o. F) Treating RN: [name] Primary Care Provider: [name] Other Clinician: Referring Provider: [name] Treating Provider/Extender: Skipper Cliche in Treatment: 0 Diagnosis Coding ICD-10 Codes Code Description [phone] Chronic venous hypertension (idiopathic) with ulcer of left lower extremity L97.822 Non-pressure chronic ulcer of other part of left lower leg  with fat layer exposed E66.01 Morbid (severe) obesity due to excess calories Facility Procedures CPT4 Code: [id number] Description: [phone] - WOUND CARE VISIT-LEV 2 EST PT Modifier: Quantity: 1 Physician Procedures CPT4 Code: [id number] Description: 85027 - WC PHYS LEVEL 4 - EST PT Modifier: Quantity: 1 CPT4 Code: Description: ICD-10 Diagnosis Description I87.312 Chronic venous hypertension (idiopathic) with ulcer of left lower extre X41.287 Non-pressure chronic ulcer of other part of left lower leg with fat lay E66.01 Morbid (severe) obesity due to excess  calories Modifier: mity er exposed Quantity: Electronic Signature(s) Signed: 04/06/2022 2:04:57 PM By: Worthy Keeler PA-C Entered By: Worthy Keeler on 04/06/2022 14:04:56

## 2022-04-07 NOTE — Progress Notes (Signed)
FANCHON, PAPANIA ([id number]) Visit Report for 04/06/2022 Arrival Information Details Patient Name: Betty Fisher, Betty Fisher. Date of Service: 04/06/2022 12:30 PM Medical Record Number: [id number] Patient Account Number: [id number] Date of Birth/Sex: [birth year] (26 y.o. F) Treating RN: [name] Primary Care Norah Fick: [name] Other Clinician: Referring Dorismar Chay: [name] Treating Taylar Hartsough/Extender: Skipper Cliche in Treatment: 0 Visit Information History Since Last Visit All ordered tests and consults were completed: No Patient Arrived: Ambulatory Added or deleted any medications: No Arrival Time: 12:43 Any new allergies or adverse reactions: No Accompanied By: self Had a fall or experienced change in No Transfer Assistance: None activities of daily living that may affect Patient Identification Verified: Yes risk of falls: Secondary Verification Process Completed: Yes Signs or symptoms of abuse/neglect since last visito No Patient Requires Transmission-Based Precautions: No Hospitalized since last visit: No Patient Has Alerts: No Implantable device outside of the clinic excluding No cellular tissue based products placed in the center since last visit: Has Dressing in Place as Prescribed: Yes Pain Present Now: Yes Electronic Signature(s) Signed: 04/07/2022 9:45:44 AM By: [name] RN Entered By: [name] on 04/06/2022 12:46:56 Betty Fisher ([id number]) -------------------------------------------------------------------------------- Clinic Level of Care Assessment Details Patient Name: Betty Fisher Date of Service: 04/06/2022 12:30 PM Medical Record Number: [id number] Patient Account Number: [id number] Date of Birth/Sex: [birth year] (26 y.o. F) Treating RN: [name] Primary Care Francy Mcilvaine: [name] Other Clinician: Referring Darlin Stenseth: [name] Treating Pammy Vesey/Extender: Skipper Cliche in Treatment: 0 Clinic Level of Care Assessment Items TOOL 4  Quantity Score X - Use when only an EandM is performed on FOLLOW-UP visit 1 0 ASSESSMENTS - Nursing Assessment / Reassessment X - Reassessment of Co-morbidities (includes updates in patient status) 1 10 X- 1 5 Reassessment of Adherence to Treatment Plan ASSESSMENTS - Wound and Skin Assessment / Reassessment X - Simple Wound Assessment / Reassessment - one wound 1 5 '[]'$  - 0 Complex Wound Assessment / Reassessment - multiple wounds '[]'$  - 0 Dermatologic / Skin Assessment (not related to wound area) ASSESSMENTS - Focused Assessment '[]'$  - Circumferential Edema Measurements - multi extremities 0 '[]'$  - 0 Nutritional Assessment / Counseling / Intervention '[]'$  - 0 Lower Extremity Assessment (monofilament, tuning fork, pulses) '[]'$  - 0 Peripheral Arterial Disease Assessment (using hand held doppler) ASSESSMENTS - Ostomy and/or Continence Assessment and Care '[]'$  - Incontinence Assessment and Management 0 '[]'$  - 0 Ostomy Care Assessment and Management (repouching, etc.) PROCESS - Coordination of Care X - Simple Patient / Family Education for ongoing care 1 15 '[]'$  - 0 Complex (extensive) Patient / Family Education for ongoing care '[]'$  - 0 Staff obtains Programmer, systems, Records, Test Results / Process Orders '[]'$  - 0 Staff telephones HHA, Nursing Homes / Clarify orders / etc '[]'$  - 0 Routine Transfer to another Facility (non-emergent condition) '[]'$  - 0 Routine Hospital Admission (non-emergent condition) '[]'$  - 0 New Admissions / Biomedical engineer / Ordering NPWT, Apligraf, etc. '[]'$  - 0 Emergency Hospital Admission (emergent condition) X- 1 10 Simple Discharge Coordination '[]'$  - 0 Complex (extensive) Discharge Coordination PROCESS - Special Needs '[]'$  - Pediatric / Minor Patient Management 0 '[]'$  - 0 Isolation Patient Management '[]'$  - 0 Hearing / Language / Visual special needs '[]'$  - 0 Assessment of Community assistance (transportation, D/C planning, etc.) '[]'$  - 0 Additional assistance / Altered  mentation '[]'$  - 0 Support Surface(s) Assessment (bed, cushion, seat, etc.) INTERVENTIONS - Wound Cleansing / Measurement Betty Fisher, Betty N. ([id number]) X- 1 5 Simple Wound Cleansing -  one wound '[]'$  - 0 Complex Wound Cleansing - multiple wounds X- 1 5 Wound Imaging (photographs - any number of wounds) '[]'$  - 0 Wound Tracing (instead of photographs) X- 1 5 Simple Wound Measurement - one wound '[]'$  - 0 Complex Wound Measurement - multiple wounds INTERVENTIONS - Wound Dressings X - Small Wound Dressing one or multiple wounds 1 10 '[]'$  - 0 Medium Wound Dressing one or multiple wounds '[]'$  - 0 Large Wound Dressing one or multiple wounds '[]'$  - 0 Application of Medications - topical '[]'$  - 0 Application of Medications - injection INTERVENTIONS - Miscellaneous '[]'$  - External ear exam 0 '[]'$  - 0 Specimen Collection (cultures, biopsies, blood, body fluids, etc.) '[]'$  - 0 Specimen(s) / Culture(s) sent or taken to Lab for analysis '[]'$  - 0 Patient Transfer (multiple staff / Civil Service fast streamer / Similar devices) '[]'$  - 0 Simple Staple / Suture removal (25 or less) '[]'$  - 0 Complex Staple / Suture removal (26 or more) '[]'$  - 0 Hypo / Hyperglycemic Management (close monitor of Blood Glucose) '[]'$  - 0 Ankle / Brachial Index (ABI) - do not check if billed separately X- 1 5 Vital Signs Has the patient been seen at the hospital within the last three years: Yes Total Score: 75 Level Of Care: New/Established - Level 2 Electronic Signature(s) Signed: 04/07/2022 9:45:44 AM By: [name] RN Entered By: [name] on 04/06/2022 13:14:27 Betty Fisher ([id number]) -------------------------------------------------------------------------------- Encounter Discharge Information Details Patient Name: Betty Fisher Date of Service: 04/06/2022 12:30 PM Medical Record Number: [id number] Patient Account Number: [id number] Date of Birth/Sex: 1996-10-26 (26 y.o. F) Treating RN: [name] Primary Care Hilary Pundt: [name]  Other Clinician: Referring [employer]: [name] Treating Cheyenne Bordeaux/Extender: Skipper Cliche in Treatment: 0 Encounter Discharge Information Items Post Procedure Vitals Discharge Condition: Stable Temperature (F): 98.2 Ambulatory Status: Ambulatory Pulse (bpm): 84 Discharge Destination: Home Respiratory Rate (breaths/min): 18 Transportation: Private Auto Blood Pressure (mmHg): 153/78 Accompanied By: self Schedule Follow-up Appointment: Yes Clinical Summary of Care: Electronic Signature(s) Signed: 04/07/2022 9:45:44 AM By: [name] RN Entered By: [name] on 04/06/2022 13:15:33 Betty Fisher ([id number]) -------------------------------------------------------------------------------- Lower Extremity Assessment Details Patient Name: Betty Fisher Date of Service: 04/06/2022 12:30 PM Medical Record Number: [id number] Patient Account Number: [id number] Date of Birth/Sex: 03-Jan-1996 (26 y.o. F) Treating RN: [name] Primary Care Johnathan Tortorelli: [name] Other Clinician: Referring Bud Kaeser: [name] Treating Rahma Meller/Extender: [name] Weeks in Treatment: 0 Edema Assessment Assessed: [Left: No] [Right: No] Edema: [Left: Ye] [Right: s] Calf Left: Right: Point of Measurement: 32 cm From Medial Instep 53 cm Ankle Left: Right: Point of Measurement: 12 cm From Medial Instep 29 cm Knee To Floor Left: Right: From Medial Instep 44 cm Vascular Assessment Pulses: Dorsalis Pedis Palpable: [Left:Yes] Electronic Signature(s) Signed: 04/07/2022 9:45:44 AM By: [name] RN Entered By: [name] on 04/06/2022 12:52:44 Betty Fisher ([id number]) -------------------------------------------------------------------------------- Multi Wound Chart Details Patient Name: Betty Fisher Date of Service: 04/06/2022 12:30 PM Medical Record Number: [id number] Patient Account Number: [id number] Date of Birth/Sex: 1996-05-02 (26 y.o. F) Treating RN: [name] Primary  Care Aarion Metzgar: [name] Other Clinician: Referring Christine Morton: [name] Treating Jayston Trevino/Extender: Skipper Cliche in Treatment: 0 Vital Signs Height(in): Pulse(bpm): 41 Weight(lbs): Blood Pressure(mmHg): 153/78 Body Mass Index(BMI): Temperature(F): 98.2 Respiratory Rate(breaths/min): 18 Photos: [N/A:N/A] Wound Location: Left, Anterior Lower Leg N/A N/A Wounding Event: Trauma N/A N/A Primary Etiology: Open Surgical Wound N/A N/A Date Acquired: 01/28/2022 N/A N/A Weeks of Treatment: 0 N/A N/A Wound  Status: Open N/A N/A Wound Recurrence: No N/A N/A Measurements L x W x D (cm) 2x0.7x0.1 N/A N/A Area (cm) : 1.1 N/A N/A Volume (cm) : 0.11 N/A N/A Classification: Full Thickness Without Exposed N/A N/A Support Structures Exudate Amount: Medium N/A N/A Exudate Type: Serosanguineous N/A N/A Exudate Color: red, brown N/A N/A Granulation Amount: Medium (34-66%) N/A N/A Granulation Quality: Red, Pink N/A N/A Necrotic Amount: Medium (34-66%) N/A N/A Exposed Structures: Fat Layer (Subcutaneous Tissue): N/A N/A Yes Fascia: No Tendon: No Muscle: No Joint: No Bone: No Epithelialization: None N/A N/A Treatment Notes Electronic Signature(s) Signed: 04/07/2022 9:45:44 AM By: [name] RN Entered By: [name] on 04/06/2022 12:53:49 Betty Fisher ([id number]) -------------------------------------------------------------------------------- [place] Details Patient Name: Betty Fisher Date of Service: 04/06/2022 12:30 PM Medical Record Number: [id number] Patient Account Number: [id number] Date of Birth/Sex: 11-Jul-1996 (26 y.o. F) Treating RN: [name] Primary Care Joniya Boberg: [name] Other Clinician: Referring Joaquim Tolen: [name] Treating Ellicia Alix/Extender: Skipper Cliche in Treatment: 0 Active Inactive Wound/Skin Impairment Nursing Diagnoses: Knowledge deficit related to ulceration/compromised skin integrity Goals: Patient/caregiver  will verbalize understanding of skin care regimen Date Initiated: 04/06/2022 Target Resolution Date: 05/06/2022 Goal Status: Active Ulcer/skin breakdown will have a volume reduction of 30% by week 4 Date Initiated: 04/06/2022 Target Resolution Date: 06/06/2022 Goal Status: Active Ulcer/skin breakdown will have a volume reduction of 50% by week 8 Date Initiated: 04/06/2022 Target Resolution Date: 07/07/2022 Goal Status: Active Ulcer/skin breakdown will have a volume reduction of 80% by week 12 Date Initiated: 04/06/2022 Target Resolution Date: 08/06/2022 Goal Status: Active Ulcer/skin breakdown will heal within 14 weeks Date Initiated: 04/06/2022 Target Resolution Date: 09/06/2022 Goal Status: Active Interventions: Assess patient/caregiver ability to obtain necessary supplies Assess patient/caregiver ability to perform ulcer/skin care regimen upon admission and as needed Assess ulceration(s) every visit Notes: Electronic Signature(s) Signed: 04/07/2022 9:45:44 AM By: [name] RN Entered By: [name] on 04/06/2022 12:53:39 Betty Fisher ([id number]) -------------------------------------------------------------------------------- Pain Assessment Details Patient Name: Betty Fisher Date of Service: 04/06/2022 12:30 PM Medical Record Number: [id number] Patient Account Number: [id number] Date of Birth/Sex: [birth year] (26 y.o. F) Treating RN: [name] Primary Care Pecola Haxton: [name] Other Clinician: Referring Cornell Gaber: [name] Treating Konnar Ben/Extender: Skipper Cliche in Treatment: 0 Active Problems Location of Pain Severity and Description of Pain Patient Has Paino Yes Site Locations Duration of the Pain. Constant / Intermittento Constant Rate the pain. Current Pain Level: 8 Worst Pain Level: 9 Least Pain Level: 4 Character of Pain Describe the Pain: Aching Pain Management and Medication Current Pain Management: Medication: No Cold Application: No Rest:  Yes Massage: No Activity: No T.E.FisherS.: No Heat Application: No Leg drop or elevation: No Is the Current Pain Management Adequate: Inadequate How does your wound impact your activities of daily livingo Sleep: Yes Bathing: No Appetite: No Relationship With Others: No Bladder Continence: No Emotions: No Bowel Continence: No Work: No Toileting: No Drive: No Dressing: No Hobbies: No Electronic Signature(s) Signed: 04/07/2022 9:45:44 AM By: [name] RN Entered By: [name] on 04/06/2022 12:47:50 Betty Fisher ([id number]) -------------------------------------------------------------------------------- Patient/Caregiver Education Details Patient Name: Betty Fisher Date of Service: 04/06/2022 12:30 PM Medical Record Number: [id number] Patient Account Number: [id number] Date of Birth/Gender: [birth year] (26 y.o. F) Treating RN: [name] Primary Care Physician: [name] Other Clinician: Referring Physician: [name] Treating Physician/Extender: Skipper Cliche in Treatment: 0 Education Assessment Education Provided To: Patient Education Topics Provided Wound/Skin Impairment: Methods: Explain/Verbal Responses: State content  correctly Electronic Signature(s) Signed: 04/07/2022 9:45:44 AM By: [name] RN Entered By: [name] on 04/06/2022 13:14:50 Betty Fisher ([id number]) -------------------------------------------------------------------------------- Wound Assessment Details Patient Name: Betty Fisher Date of Service: 04/06/2022 12:30 PM Medical Record Number: [id number] Patient Account Number: [id number] Date of Birth/Sex: [birth year] (26 y.o. F) Treating RN: [name] Primary Care Other Atienza: [name] Other Clinician: Referring Phoenix Riesen: [name] Treating Deaveon Schoen/Extender: [name] Weeks in Treatment: 0 Wound Status Wound Number: 2 Primary Etiology: Open Surgical Wound Wound Location: Left, Anterior Lower Leg Wound Status:  Open Wounding Event: Trauma Date Acquired: 01/28/2022 Weeks Of Treatment: 0 Clustered Wound: No Photos Wound Measurements Length: (cm) 2 Width: (cm) 0.7 Depth: (cm) 0.1 Area: (cm) 1.1 Volume: (cm) 0.11 % Reduction in Area: % Reduction in Volume: Epithelialization: None Tunneling: No Undermining: No Wound Description Classification: Full Thickness Without Exposed Support Structu Exudate Amount: Medium Exudate Type: Serosanguineous Exudate Color: red, brown res Foul Odor After Cleansing: No Slough/Fibrino Yes Wound Bed Granulation Amount: Medium (34-66%) Exposed Structure Granulation Quality: Red, Pink Fascia Exposed: No Necrotic Amount: Medium (34-66%) Fat Layer (Subcutaneous Tissue) Exposed: Yes Necrotic Quality: Adherent Slough Tendon Exposed: No Muscle Exposed: No Joint Exposed: No Bone Exposed: No Electronic Signature(s) Signed: 04/07/2022 9:45:44 AM By: [name] RN Entered By: [name] on 04/06/2022 12:51:44 Betty Fisher ([id number]) -------------------------------------------------------------------------------- Vitals Details Patient Name: Betty Fisher Date of Service: 04/06/2022 12:30 PM Medical Record Number: [id number] Patient Account Number: [id number] Date of Birth/Sex: 08-Jan-1996 (26 y.o. F) Treating RN: [name] Primary Care Tacoya Altizer: [name] Other Clinician: Referring Tajanae Guilbault: [name] Treating Benjiman Sedgwick/Extender: [name] Weeks in Treatment: 0 Vital Signs Time Taken: 12:46 Temperature (F): 98.2 Pulse (bpm): 84 Respiratory Rate (breaths/min): 18 Blood Pressure (mmHg): 153/78 Reference Range: 80 - 120 mg / dl Electronic Signature(s) Signed: 04/07/2022 9:45:44 AM By: [name] RN Entered By: [name] on 04/06/2022 12:46:59

## 2022-04-14 ENCOUNTER — Encounter: Payer: 59 | Admitting: Physician Assistant

## 2022-04-14 ENCOUNTER — Ambulatory Visit: Payer: 59 | Admitting: Physician Assistant

## 2022-04-14 DIAGNOSIS — L97822 Non-pressure chronic ulcer of other part of left lower leg with fat layer exposed: Secondary | ICD-10-CM | POA: Diagnosis not present

## 2022-04-14 NOTE — Progress Notes (Signed)
KORINA, TRETTER ([id number]) Visit Report for 04/14/2022 Arrival Information Details Patient Name: Betty Fisher, Betty Fisher. Date of Service: 04/14/2022 10:15 AM Medical Record Number: [id number] Patient Account Number: [id number] Date of Birth/Sex: October 21, 1996 (26 y.o. F) Treating RN: [name] Primary Care Anjeanette Petzold: [name] Other Clinician: Referring Canaan Prue: [name] Treating Havyn Ramo/Extender: Skipper Cliche in Treatment: 1 Visit Information History Since Last Visit All ordered tests and consults were completed: No Patient Arrived: Ambulatory Added or deleted any medications: No Arrival Time: 09:48 Any new allergies or adverse reactions: No Accompanied By: self Had a fall or experienced change in No Transfer Assistance: None activities of daily living that may affect Patient Identification Verified: Yes risk of falls: Secondary Verification Process Completed: Yes Signs or symptoms of abuse/neglect since last visito No Patient Requires Transmission-Based Precautions: No Hospitalized since last visit: No Patient Has Alerts: No Implantable device outside of the clinic excluding No cellular tissue based products placed in the center since last visit: Has Dressing in Place as Prescribed: Yes Has Compression in Place as Prescribed: Yes Pain Present Now: Yes Electronic Signature(s) Unsigned Entered ByCarlene Coria on 04/14/2022 09:54:22 Signature(s): Date(s): [name] ([id number]) -------------------------------------------------------------------------------- Lower Extremity Assessment Details Patient Name: Betty Fisher, Betty Fisher. Date of Service: 04/14/2022 10:15 AM Medical Record Number: [id number] Patient Account Number: [id number] Date of Birth/Sex: [birth year] (26 y.o. F) Treating RN: [name] Primary Care Briany Aye: [name] Other Clinician: Referring Noa Galvao: [name] Treating Mickel Schreur/Extender: [name] Weeks in Treatment: 1 Edema  Assessment Assessed: [Left: No] [Right: No] Edema: [Left: Ye] [Right: s] Calf Left: Right: Point of Measurement: 32 cm From Medial Instep 52 cm Ankle Left: Right: Point of Measurement: 12 cm From Medial Instep 27 cm Knee To Floor Left: Right: From Medial Instep 44 cm Vascular Assessment Pulses: Dorsalis Pedis Palpable: [Left:Yes] Electronic Signature(s) Unsigned Entered ByCarlene Coria on 04/14/2022 09:59:27 Signature(s): Date(s): [name] ([id number]) -------------------------------------------------------------------------------- Multi Wound Chart Details Patient Name: Betty Fisher, Betty Fisher. Date of Service: 04/14/2022 10:15 AM Medical Record Number: [id number] Patient Account Number: [id number] Date of Birth/Sex: 1996/03/21 (26 y.o. F) Treating RN: [name] Primary Care Brnadon Eoff: [name] Other Clinician: Referring Iriel Nason: [name] Treating Nehal Shives/Extender: Skipper Cliche in Treatment: 1 Vital Signs Height(in): Pulse(bpm): 89 Weight(lbs): Blood Pressure(mmHg): 146/97 Body Mass Index(BMI): Temperature(F): 98 Respiratory Rate(breaths/min): 18 Photos: [2:No Photos] [N/A:N/A] Wound Location: [2:Left, Anterior Lower Leg] [N/A:N/A] Wounding Event: [2:Surgical Injury] [N/A:N/A] Primary Etiology: [2:Venous Leg Ulcer] [N/A:N/A] Date Acquired: [2:01/28/2022] [N/A:N/A] Weeks of Treatment: [2:1] [N/A:N/A] Wound Status: [2:Open] [N/A:N/A] Wound Recurrence: [2:No] [N/A:N/A] Measurements L x W x D (cm) [2:0.1x0.1x0.1] [N/A:N/A] Area (cm) : [2:0.008] [N/A:N/A] Volume (cm) : [2:0.001] [N/A:N/A] % Reduction in Area: [2:99.30%] [N/A:N/A] % Reduction in Volume: [2:99.10%] [N/A:N/A] Classification: [2:Full Thickness Without Exposed Support Structures] [N/A:N/A] Exudate Amount: [2:Medium] [N/A:N/A] Exudate Type: [2:Serosanguineous] [N/A:N/A] Exudate Color: [2:red, brown] [N/A:N/A] Granulation Amount: [2:Medium (34-66%)] [N/A:N/A] Granulation Quality: [2:Red,  Pink] [N/A:N/A] Necrotic Amount: [2:Medium (34-66%)] [N/A:N/A] Exposed Structures: [2:Fat Layer (Subcutaneous Tissue): Yes Fascia: No Tendon: No Muscle: No Joint: No Bone: No None] [N/A:N/A N/A] Treatment Notes Electronic Signature(s) Unsigned Entered ByCarlene Coria on 04/14/2022 09:59:40 Signature(s): Date(s): [name] ([id number]) -------------------------------------------------------------------------------- [place] Details Patient Name: Betty Fisher, Betty Fisher. Date of Service: 04/14/2022 10:15 AM Medical Record Number: [id number] Patient Account Number: [id number] Date of Birth/Sex: May 05, 1996 (26 y.o. F) Treating RN: [name] Primary Care Angelamarie Avakian: [name] Other Clinician: Referring Nykira Reddix: [name] Treating Khayri Kargbo/Extender: Skipper Cliche in Treatment: 1 Active Inactive  Wound/Skin Impairment Nursing Diagnoses: Knowledge deficit related to ulceration/compromised skin integrity Goals: Patient/caregiver will verbalize understanding of skin care regimen Date Initiated: 04/06/2022 Target Resolution Date: 05/06/2022 Goal Status: Active Ulcer/skin breakdown will have a volume reduction of 30% by week 4 Date Initiated: 04/06/2022 Target Resolution Date: 06/06/2022 Goal Status: Active Ulcer/skin breakdown will have a volume reduction of 50% by week 8 Date Initiated: 04/06/2022 Target Resolution Date: 07/07/2022 Goal Status: Active Ulcer/skin breakdown will have a volume reduction of 80% by week 12 Date Initiated: 04/06/2022 Target Resolution Date: 08/06/2022 Goal Status: Active Ulcer/skin breakdown will heal within 14 weeks Date Initiated: 04/06/2022 Target Resolution Date: 09/06/2022 Goal Status: Active Interventions: Assess patient/caregiver ability to obtain necessary supplies Assess patient/caregiver ability to perform ulcer/skin care regimen upon admission and as needed Assess ulceration(s) every visit Notes: Electronic  Signature(s) Unsigned Entered By: [name] on 04/14/2022 09:59:32 Signature(s): Date(s): [name] ([id number]) -------------------------------------------------------------------------------- Pain Assessment Details Patient Name: Betty Fisher, Betty Fisher. Date of Service: 04/14/2022 10:15 AM Medical Record Number: [id number] Patient Account Number: [id number] Date of Birth/Sex: 10-27-1996 (26 y.o. F) Treating RN: [name] Primary Care Almeda Ezra: [name] Other Clinician: Referring Ayen Viviano: [name] Treating Tashona Calk/Extender: Skipper Cliche in Treatment: 1 Active Problems Location of Pain Severity and Description of Pain Patient Has Paino Yes Site Locations Duration of the Pain. Constant / Intermittento Intermittent How Long Does it Lasto Hours: 1 Minutes: Rate the pain. Current Pain Level: 0 Worst Pain Level: 10 Least Pain Level: 0 Tolerable Pain Level: 5 Character of Pain Describe the Pain: Aching, Throbbing Pain Management and Medication Current Pain Management: Medication: Yes Rest: Yes How does your wound impact your activities of daily livingo Sleep: Yes Bathing: No Appetite: No Relationship With Others: No Bladder Continence: No Emotions: No Bowel Continence: No Work: No Toileting: No Drive: No Dressing: No Hobbies: No Electronic Signature(s) Unsigned Entered ByCarlene Coria on 04/14/2022 09:55:38 Signature(s): Date(s): [name] ([id number]) -------------------------------------------------------------------------------- Wound Assessment Details Patient Name: Betty Fisher, Betty Fisher. Date of Service: 04/14/2022 10:15 AM Medical Record Number: [id number] Patient Account Number: [id number] Date of Birth/Sex: [birth year] (26 y.o. F) Treating RN: [name] Primary Care Caysen Whang: [name] Other Clinician: Referring Aarion Metzgar: [name] Treating Damarie Schoolfield/Extender: [name] Weeks in Treatment: 1 Wound Status Wound Number: 2 Primary  Etiology: Venous Leg Ulcer Wound Location: Left, Anterior Lower Leg Wound Status: Open Wounding Event: Surgical Injury Date Acquired: 01/28/2022 Weeks Of Treatment: 1 Clustered Wound: No Photos Wound Measurements Length: (cm) 0.1 Width: (cm) 0.1 Depth: (cm) 0.1 Area: (cm) 0.008 Volume: (cm) 0.001 % Reduction in Area: 99.3% % Reduction in Volume: 99.1% Epithelialization: None Tunneling: No Undermining: No Wound Description Classification: Full Thickness Without Exposed Support Structu Exudate Amount: Medium Exudate Type: Serosanguineous Exudate Color: red, brown res Foul Odor After Cleansing: No Slough/Fibrino Yes Wound Bed Granulation Amount: Medium (34-66%) Exposed Structure Granulation Quality: Red, Pink Fascia Exposed: No Necrotic Amount: Medium (34-66%) Fat Layer (Subcutaneous Tissue) Exposed: Yes Necrotic Quality: Adherent Slough Tendon Exposed: No Muscle Exposed: No Joint Exposed: No Bone Exposed: No Electronic Signature(s) Signed: 04/14/2022 10:01:31 AM By: [name] RN Entered By: [name] on 04/14/2022 10:01:31 [name] ([id number]) -------------------------------------------------------------------------------- Vitals Details Patient Name: [name] Date of Service: 04/14/2022 10:15 AM Medical Record Number: [id number] Patient Account Number: [id number] Date of Birth/Sex: [birth year] (26 y.o. F) Treating RN: [name] Primary Care Darlina Mccaughey: [name] Other Clinician: Referring Bianka Liberati: [name] Treating Brandley Aldrete/Extender: [name] Weeks in Treatment: 1 Vital Signs Time Taken: 09:54  Temperature (F): 98 Pulse (bpm): 89 Respiratory Rate (breaths/min): 18 Blood Pressure (mmHg): 146/97 Reference Range: 80 - 120 mg / dl Electronic Signature(s) Unsigned Entered ByCarlene Coria on 04/14/2022 09:54:49 Signature(s): Date(s):

## 2022-04-14 NOTE — Progress Notes (Signed)
EMARA, LICHTER ([id number]) Visit Report for 04/14/2022 Chief Complaint Document Details Patient Name: Betty Fisher, Betty Fisher. Date of Service: 04/14/2022 10:15 AM Medical Record Number: [id number] Patient Account Number: [id number] Date of Birth/Sex: Mar 14, 1996 (26 y.o. F) Treating RN: [name] Primary Care Provider: [name] Other Clinician: Referring Provider: [name] Treating Provider/Extender: Skipper Cliche in Treatment: 1 Information Obtained from: Patient Chief Complaint Left LE Ulcer Electronic Signature(s) Signed: 04/14/2022 9:53:52 AM By: Worthy Keeler PA-C Entered By: Worthy Keeler on 04/14/2022 09:53:52 Betty Fisher ([id number]) -------------------------------------------------------------------------------- Problem List Details Patient Name: Betty Fisher Date of Service: 04/14/2022 10:15 AM Medical Record Number: [id number] Patient Account Number: [id number] Date of Birth/Sex: [birth year] (26 y.o. 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

## 2022-04-21 ENCOUNTER — Ambulatory Visit (INDEPENDENT_AMBULATORY_CARE_PROVIDER_SITE_OTHER): Payer: 59 | Admitting: Nurse Practitioner

## 2022-04-21 ENCOUNTER — Ambulatory Visit (INDEPENDENT_AMBULATORY_CARE_PROVIDER_SITE_OTHER): Payer: 59

## 2022-04-21 ENCOUNTER — Encounter (INDEPENDENT_AMBULATORY_CARE_PROVIDER_SITE_OTHER): Payer: Self-pay | Admitting: Nurse Practitioner

## 2022-04-21 VITALS — BP 138/89 | HR 91 | Resp 16 | Wt 382.0 lb

## 2022-04-21 DIAGNOSIS — I83813 Varicose veins of bilateral lower extremities with pain: Secondary | ICD-10-CM | POA: Diagnosis not present

## 2022-04-21 DIAGNOSIS — T792XXA Traumatic secondary and recurrent hemorrhage and seroma, initial encounter: Secondary | ICD-10-CM | POA: Diagnosis not present

## 2022-04-21 DIAGNOSIS — M7989 Other specified soft tissue disorders: Secondary | ICD-10-CM | POA: Diagnosis not present

## 2022-04-21 DIAGNOSIS — M79662 Pain in left lower leg: Secondary | ICD-10-CM | POA: Diagnosis not present

## 2022-04-21 DIAGNOSIS — I83819 Varicose veins of unspecified lower extremities with pain: Secondary | ICD-10-CM | POA: Diagnosis not present

## 2022-04-24 ENCOUNTER — Encounter: Payer: 59 | Admitting: Physician Assistant

## 2022-04-24 DIAGNOSIS — L97822 Non-pressure chronic ulcer of other part of left lower leg with fat layer exposed: Secondary | ICD-10-CM | POA: Diagnosis not present

## 2022-04-26 ENCOUNTER — Encounter (INDEPENDENT_AMBULATORY_CARE_PROVIDER_SITE_OTHER): Payer: Self-pay | Admitting: Vascular Surgery

## 2022-05-03 ENCOUNTER — Encounter (INDEPENDENT_AMBULATORY_CARE_PROVIDER_SITE_OTHER): Payer: Self-pay | Admitting: Nurse Practitioner

## 2022-05-03 NOTE — Progress Notes (Signed)
Subjective:    Patient ID: Betty Fisher, female    DOB: 1996/02/14, 26 y.o.   MRN: [id number] Chief Complaint  Patient presents with   Follow-up    Ultrasound follow up    The patient returns for followup evaluation 3 months after the initial visit. The patient continues to have pain in the lower extremities with dependency. The pain is lessened with elevation. Graduated compression stockings, Class I (20-30 mmHg), have been worn but the stockings do not eliminate the leg pain. Over-the-counter analgesics do not improve the symptoms. The degree of discomfort continues to interfere with daily activities. The patient notes the pain in the legs is causing problems with daily exercise, at the workplace and even with household activities and maintenance such as standing in the kitchen preparing meals and doing dishes.   Venous ultrasound shows normal deep venous system, no evidence of acute or chronic DVT.  Superficial reflux is present in the left great saphenous vein.    Review of Systems  Cardiovascular:  Positive for leg swelling.  All other systems reviewed and are negative.      Objective:   Physical Exam Vitals reviewed.  HENT:     Head: Normocephalic.  Cardiovascular:     Rate and Rhythm: Normal rate and regular rhythm.     Pulses: Normal pulses.  Pulmonary:     Effort: Pulmonary effort is normal.  Musculoskeletal:        General: Tenderness present.     Left lower leg: Edema present.  Skin:    General: Skin is warm and dry.  Neurological:     Mental Status: She is alert and oriented to person, place, and time.  Psychiatric:        Mood and Affect: Mood normal.        Behavior: Behavior normal.        Thought Content: Thought content normal.        Judgment: Judgment normal.     BP 138/89 (BP Location: Right Arm)   Pulse 91   Resp 16   Wt (!) 382 lb (173.3 kg)   BMI 59.83 kg/m   Past Medical History:  Diagnosis Date   Vitamin D deficiency     Social  History   Socioeconomic History   Marital status: Single    Spouse name: Not on file   Number of children: Not on file   Years of education: Not on file   Highest education level: Not on file  Occupational History   Not on file  Tobacco Use   Smoking status: Never   Smokeless tobacco: Never  Vaping Use   Vaping Use: Never used  Substance and Sexual Activity   Alcohol use: Yes    Alcohol/week: 1.0 standard drink of alcohol    Types: 1 Glasses of wine per week    Comment: occationally   Drug use: No   Sexual activity: Not Currently    Birth control/protection: None  Other Topics Concern   Not on file  Social History Narrative   Not on file   Social Determinants of Health   Financial Resource Strain: Not on file  Food Insecurity: Not on file  Transportation Needs: Not on file  Physical Activity: Not on file  Stress: Not on file  Social Connections: Not on file  Intimate Partner Violence: Not on file    Past Surgical History:  Procedure Laterality Date   INCISION AND DRAINAGE ABSCESS Left 05/25/2021   Procedure: INCISION  AND DRAINAGE SEROMA;  Surgeon: Katha Cabal, MD;  Location: [facility] ORS;  Service: Vascular;  Laterality: Left;   WISDOM TOOTH EXTRACTION      Family History  Problem Relation Age of Onset   Hypertension Mother    Hypothyroidism Mother    Hypertension Father     No Known Allergies     Latest Ref Rng & Units 05/24/2021    2:52 PM 07/28/2020   10:06 AM 09/07/2017   12:33 PM  CBC  WBC 4.0 - 10.5 K/uL 7.9  7.4  9.8   Hemoglobin 12.0 - 15.0 g/dL 14.8  14.7  15.3   Hematocrit 36.0 - 46.0 % 44.2  43.1  45.8   Platelets 150 - 400 K/uL 388  357  353       CMP     Component Value Date/Time   NA 139 05/24/2021 1452   K 4.0 05/24/2021 1452   CL 104 05/24/2021 1452   CO2 25 05/24/2021 1452   GLUCOSE 96 05/24/2021 1452   BUN 17 05/24/2021 1452   CREATININE 0.88 05/24/2021 1452   CALCIUM 9.0 05/24/2021 1452   PROT 6.7 07/28/2020 1006    ALBUMIN 4.0 07/28/2020 1006   AST 23 07/28/2020 1006   ALT 34 07/28/2020 1006   ALKPHOS 93 07/28/2020 1006   BILITOT 0.5 07/28/2020 1006   GFRNONAA >60 05/24/2021 1452   GFRAA >60 07/28/2020 1006     No results found.     Assessment & Plan:   1. Varicose veins with pain Recommend  I have reviewed my previous  discussion with the patient regarding  varicose veins and why they cause symptoms. Patient will continue  wearing graduated compression stockings class 1 on a daily basis, beginning first thing in the morning and removing them in the evening.    In addition, behavioral modification including elevation during the day was again discussed and this will continue.  The patient has utilized over the counter pain medications and has been exercising.  However, at this time conservative therapy has not alleviated the patient's symptoms of leg pain and swelling  Recommend: laser ablation of the left great saphenous veins to eliminate the symptoms of pain and swelling of the lower extremities caused by the severe superficial venous reflux disease.    2. Posttraumatic seroma (HCC) Currently this is resolved with no recurrence   Current Outpatient Medications on File Prior to Visit  Medication Sig Dispense Refill   aspirin 81 MG EC tablet Take 81 mg by mouth daily. Swallow whole.     ibuprofen (ADVIL) 800 MG tablet Take 1 tablet (800 mg total) by mouth every 8 (eight) hours as needed. (Patient taking differently: Take 800 mg by mouth every 8 (eight) hours as needed for cramping (menstrual pain relief).) 30 tablet 3   levothyroxine (SYNTHROID) 75 MCG tablet Take 1 tablet (75 mcg total) by mouth daily. (Patient taking differently: Take 75 mcg by mouth daily before breakfast.) 90 tablet 3   No current facility-administered medications on file prior to visit.    There are no Patient Instructions on file for this visit. No follow-ups on file.   Kris Hartmann, NP

## 2022-05-10 ENCOUNTER — Encounter (INDEPENDENT_AMBULATORY_CARE_PROVIDER_SITE_OTHER): Payer: Self-pay

## 2022-05-22 ENCOUNTER — Telehealth (INDEPENDENT_AMBULATORY_CARE_PROVIDER_SITE_OTHER): Payer: Self-pay | Admitting: Vascular Surgery

## 2022-05-22 NOTE — Telephone Encounter (Signed)
LVM  X 2 for pt:  Letting her know that I was going to follow up with [employer] on the denied prior auth for the laser ablation procedure Dr. Lucky Cowboy is recommending and that I would call her back to let her know what I found out.  2. After I spoke with [employer] rep and found out that it was actually approved today at 10:14, but had not been updated online, and that I would call her after I received confirmation.   Will call patient to schedule after I receive confirmation. Still awaiting scheduling for laser ablations due to back ordered supplies.

## 2022-06-19 ENCOUNTER — Encounter (INDEPENDENT_AMBULATORY_CARE_PROVIDER_SITE_OTHER): Payer: Self-pay | Admitting: Vascular Surgery

## 2022-06-19 ENCOUNTER — Ambulatory Visit: Payer: Commercial Managed Care - HMO | Admitting: Physician Assistant

## 2022-06-19 ENCOUNTER — Encounter (INDEPENDENT_AMBULATORY_CARE_PROVIDER_SITE_OTHER): Payer: Self-pay

## 2022-06-19 ENCOUNTER — Ambulatory Visit
Admission: EM | Admit: 2022-06-19 | Discharge: 2022-06-19 | Disposition: A | Discharge status: Home / Self Care | Payer: Commercial Managed Care - HMO | Attending: Emergency Medicine | Admitting: Emergency Medicine

## 2022-06-19 DIAGNOSIS — L03116 Cellulitis of left lower limb: Secondary | ICD-10-CM | POA: Diagnosis not present

## 2022-06-19 DIAGNOSIS — R030 Elevated blood-pressure reading, without diagnosis of hypertension: Secondary | ICD-10-CM

## 2022-06-19 MED ORDER — DOXYCYCLINE HYCLATE 100 MG PO CAPS: 100.0000 mg | ORAL_CAPSULE | Freq: Two times a day (BID) | ORAL | 0 refills | Status: DC

## 2022-06-19 NOTE — ED Provider Notes (Signed)
[facility]    CSN: [id number] Arrival date & time: 06/19/22  1800      History   Chief Complaint Chief Complaint  Patient presents with   Leg Pain    HPI Betty Fisher is a 26 y.o. female.  Patient presents with 2-day history of fatigue and chills.  She states the chronic wound on her left lower leg started draining approximately 1 week ago.  She was previously followed by the wound care center for this; she was unable to get an appointment today.  She also is followed by vein and vascular.  The wound on her left lower leg has previously required a wound vac and IV antibiotics.  Patient denies fever, numbness, weakness, or other symptoms.  Her medical history includes posttraumatic seroma, morbid obesity, cellulitis, leg abscess.  The history is provided by the patient and medical records.    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   Morbid obesity with body mass index (BMI) of 50.0 to 59.9 in adult [facility]) 12/23/2017    Past Surgical History:  Procedure Laterality Date   INCISION AND DRAINAGE ABSCESS Left 05/25/2021   Procedure: INCISION AND DRAINAGE SEROMA;  Surgeon: Katha Cabal, MD;  Location: [facility] ORS;  Service: Vascular;  Laterality: Left;   WISDOM TOOTH EXTRACTION      OB History     Gravida  0   Para  0   Term  0   Preterm  0   AB  0   Living  0      SAB  0   IAB  0   Ectopic  0   Multiple  0   Live Births  0            Home Medications    Prior to Admission medications   Medication Sig Start Date End Date Taking? Authorizing Provider  doxycycline (VIBRAMYCIN) 100 MG capsule Take 1 capsule (100 mg total) by mouth 2 (two) times daily for 7 days. 06/19/22 06/26/22 Yes Sharion Balloon, NP   aspirin 81 MG EC tablet Take 81 mg by mouth daily. Swallow whole.    [provider]  ibuprofen (ADVIL) 800 MG tablet Take 1 tablet (800 mg total) by mouth every 8 (eight) hours as needed. Patient taking differently: Take 800 mg by mouth every 8 (eight) hours as needed for cramping (menstrual pain relief). 01/11/21   Lavera Guise, MD  levothyroxine (SYNTHROID) 75 MCG tablet Take 1 tablet (75 mcg total) by mouth daily. Patient taking differently: Take 75 mcg by mouth daily before breakfast. 03/17/21   Lavera Guise, MD    Family History Family History  Problem Relation Age of Onset   Hypertension Mother    Hypothyroidism Mother    Hypertension Father     Social History Social History   Tobacco Use   Smoking status: Never   Smokeless tobacco: Never  Vaping Use   Vaping Use: Never used  Substance Use Topics   Alcohol use: Yes    Alcohol/week: 1.0 standard drink of alcohol    Types: 1 Glasses of wine per week    Comment: occationally   Drug use: No     Allergies  Patient has no known allergies.   Review of Systems Review of Systems  Constitutional:  Positive for chills and fatigue. Negative for fever.  Skin:  Positive for wound.  Neurological:  Negative for weakness and numbness.  All other systems reviewed and are negative.    Physical Exam Triage Vital Signs ED Triage Vitals  Enc Vitals Group     BP 06/19/22 1848 (!) 147/97     Pulse Rate 06/19/22 1848 (!) 103     Resp 06/19/22 1848 18     Temp 06/19/22 1848 98.4 F (36.9 C)     Temp src --      SpO2 06/19/22 1848 98 %     Weight --      Height 06/19/22 1857 '5\' 7"'$  (1.702 m)     Head Circumference --      Peak Flow --      Pain Score 06/19/22 1857 2     Pain Loc --      Pain Edu? --      Excl. in [place]? --    No data found.  Updated Vital Signs BP (!) 137/94   Pulse (!) 103   Temp 98.4 F (36.9 C)   Resp 18   Ht '5\' 7"'$  (1.702 m)   LMP 05/29/2022   SpO2 98%   BMI 59.83 kg/m   Visual  Acuity Right Eye Distance:   Left Eye Distance:   Bilateral Distance:    Right Eye Near:   Left Eye Near:    Bilateral Near:     Physical Exam Vitals and nursing note reviewed.  Constitutional:      General: She is not in acute distress.    Appearance: She is well-developed. She is obese. She is not ill-appearing.  HENT:     Mouth/Throat:     Mouth: Mucous membranes are moist.  Cardiovascular:     Rate and Rhythm: Normal rate and regular rhythm.  Pulmonary:     Effort: Pulmonary effort is normal. No respiratory distress.  Musculoskeletal:        General: Swelling and tenderness present. Normal range of motion.     Cervical back: Neck supple.  Skin:    General: Skin is warm and dry.     Capillary Refill: Capillary refill takes less than 2 seconds.     Findings: Lesion present. No erythema.     Comments: Chronic wound on left lower leg, draining clear fluid.  No erythema or purulent drainage at this time.  Neurological:     Mental Status: She is alert.     Sensory: No sensory deficit.     Motor: No weakness.     Gait: Gait normal.  Psychiatric:        Mood and Affect: Mood normal.        Behavior: Behavior normal.       UC Treatments / Results  Labs (all labs ordered are listed, but only abnormal results are displayed) Labs Reviewed - No data to display  EKG   Radiology No results found.  Procedures Procedures (including critical care time)  Medications Ordered in UC Medications - No data to display  Initial Impression / Assessment and Plan / UC Course  I have reviewed the triage vital signs and the nursing notes.  Pertinent labs & imaging results that were available during my care of the patient were reviewed by me and considered in my medical decision making (see chart for details).   Cellulitis of left  lower leg.  Elevated blood pressure reading.  Treating with doxycycline.  Wound care instructions discussed.  Urgent referral to wound care center made.   Instructed patient to follow-up with her PCP or the wound care center or vein and vascular tomorrow.  Also discussed with patient that her blood pressure is elevated today and needs to be rechecked by her PCP in 2 to 4 weeks.  She agrees to plan of care.   Final Clinical Impressions(s) / UC Diagnoses   Final diagnoses:  Cellulitis of left lower leg  Elevated blood pressure reading     Discharge Instructions      Take the doxycycline as directed.  Keep your wound clean and dry.  Wash it gently twice a day with soap and water.  Follow-up with your primary care provider or the wound care center or your vein and vascular specialist tomorrow.   A referral to the wound care center at [facility] has been ordered.   Your blood pressure is elevated today at 147/97; recheck 137/94.  Please have this rechecked by your primary care provider in 2-4 weeks.          ED Prescriptions     Medication Sig Dispense Auth. Provider   doxycycline (VIBRAMYCIN) 100 MG capsule Take 1 capsule (100 mg total) by mouth 2 (two) times daily for 7 days. 14 capsule Sharion Balloon, NP      PDMP not reviewed this encounter.   Sharion Balloon, NP 06/19/22 1940

## 2022-06-19 NOTE — Discharge Instructions (Addendum)
Take the doxycycline as directed.  Keep your wound clean and dry.  Wash it gently twice a day with soap and water.  Follow-up with your primary care provider or the wound care center or your vein and vascular specialist tomorrow.   A referral to the wound care center at [facility] has been ordered.   Your blood pressure is elevated today at 147/97; recheck 137/94.  Please have this rechecked by your primary care provider in 2-4 weeks.

## 2022-06-19 NOTE — ED Triage Notes (Addendum)
Patient to Urgent Care with complaints of possible wound infection present to left shin. Reports x2 days of sweats, chills, and generally feeling unwell. Drainage and swelling present. Patient has been wearing velcro compression wrap.   Patient reports this is a chronic wound that has been present for approx 1 year, required wound vac and IV antibiotics. States that the wound was almost closed and has been healing well for the last 2-3 months, no drainage but now is having new purulent drainage.   Reports that the wound care center were unable to see her today. Levaquin has worked well in the past.

## 2022-06-21 ENCOUNTER — Other Ambulatory Visit: Payer: Self-pay

## 2022-06-21 ENCOUNTER — Emergency Department
Admission: EM | Admit: 2022-06-21 | Discharge: 2022-06-21 | Disposition: A | Discharge status: Home / Self Care | Payer: Commercial Managed Care - HMO | Attending: Emergency Medicine | Admitting: Emergency Medicine

## 2022-06-21 DIAGNOSIS — M79605 Pain in left leg: Secondary | ICD-10-CM

## 2022-06-21 DIAGNOSIS — L03116 Cellulitis of left lower limb: Secondary | ICD-10-CM | POA: Diagnosis not present

## 2022-06-21 LAB — CBC
HCT: 46.5 % — ABNORMAL HIGH (ref 36.0–46.0)
Hemoglobin: 14.9 g/dL (ref 12.0–15.0)
MCH: 28.7 pg (ref 26.0–34.0)
MCHC: 32 g/dL (ref 30.0–36.0)
MCV: 89.4 fL (ref 80.0–100.0)
Platelets: 378 10*3/uL (ref 150–400)
RBC: 5.2 MIL/uL — ABNORMAL HIGH (ref 3.87–5.11)
RDW: 13.2 % (ref 11.5–15.5)
WBC: 8.3 10*3/uL (ref 4.0–10.5)
nRBC: 0 % (ref 0.0–0.2)

## 2022-06-21 LAB — COMPREHENSIVE METABOLIC PANEL
ALT: 25 U/L (ref 0–44)
AST: 18 U/L (ref 15–41)
Albumin: 4 g/dL (ref 3.5–5.0)
Alkaline Phosphatase: 101 U/L (ref 38–126)
Anion gap: 7 (ref 5–15)
BUN: 19 mg/dL (ref 6–20)
CO2: 26 mmol/L (ref 22–32)
Calcium: 8.9 mg/dL (ref 8.9–10.3)
Chloride: 106 mmol/L (ref 98–111)
Creatinine, Ser: 0.93 mg/dL (ref 0.44–1.00)
GFR, Estimated: >60 mL/min (ref >60)
Glucose, Bld: 93 mg/dL (ref 70–99)
Potassium: 4.4 mmol/L (ref 3.5–5.1)
Sodium: 139 mmol/L (ref 135–145)
Total Bilirubin: 0.7 mg/dL (ref 0.3–1.2)
Total Protein: 7.2 g/dL (ref 6.5–8.1)

## 2022-06-21 LAB — LACTIC ACID, PLASMA: Lactic Acid, Venous: 0.7 mmol/L (ref 0.5–1.9)

## 2022-06-21 MED ORDER — LEVOFLOXACIN 500 MG PO TABS: 500.0000 mg | ORAL_TABLET | Freq: Every day | ORAL | 0 refills | Status: AC

## 2022-06-21 NOTE — ED Triage Notes (Signed)
Pt here with left leg pain, prior leg surgery a year ago. Pt has been going to the wound care center here, started having pain Sat night. Pt given abx at [facility] which has not helped. Pt states leg is draining.

## 2022-06-21 NOTE — ED Notes (Signed)
Bandaide applied over L lower shin site.

## 2022-06-21 NOTE — ED Notes (Signed)
Pt reports feel 2 years ago; thought to be hematoma to left leg at that time; confirmed pocket of liquid not hematoma; had surgery July 27th last year; wound vac was to be used; J-p drain used instead of wound vac; pt had infection post surgery and stayed in hospital for 3 weeks; emergency surgery at that time to debride since pus noted to bone at site; pt hasn't seen wound staff in about 3 months stating things were going well; Saturday night pt started having hot flashes and noted left leg swelling; pt's resp reg/unlabored, skin dry and sitting calmly on stretcher. Pt reports unable to sleep well due to sweatiness and pain while sleeping; reports she doesn't usually deal with diaphoresis at night. Pt reports burning/throbbing at L lower shin; slight drainage noted at site; drainage yellow/red; pt reports skin is taut at L shin; L shin notably swollen; L foot warm but not hot; pt able to move L foot; L dorsalis pedis pulse noted on doppler.

## 2022-06-21 NOTE — ED Provider Notes (Signed)
[facility] Provider Note   Event Date/Time   First MD Initiated Contact with Patient 06/21/22 1404     (approximate) History  Leg Pain and Wound Check  HPI Betty Fisher is a 26 y.o. female with a past medical history of nonhealing left shin wound after an incision and drainage 2 years ago.  Patient is currently being seen with a wound care clinic but was unable to be seen today after she has had 4 days of worsening surrounding erythema, generalized fatigue, and new yellow drainage from the site.  Patient was seen in urgent care and prescribed doxycycline the patient states has not improved her symptoms.  Patient describes an 8/10, aching, throbbing, nonradiating pain overlying the middle of the anterior left shin ROS: Patient currently denies any vision changes, tinnitus, difficulty speaking, facial droop, sore throat, chest pain, shortness of breath, abdominal pain, nausea/vomiting/diarrhea, dysuria, or weakness/numbness/paresthesias in any extremity   Physical Exam  Triage Vital Signs: ED Triage Vitals  Enc Vitals Group     BP 06/21/22 1343 (!) 157/111     Pulse Rate 06/21/22 1343 73     Resp 06/21/22 1343 17     Temp 06/21/22 1343 98.5 F (36.9 C)     Temp Source 06/21/22 1343 Oral     SpO2 06/21/22 1343 96 %     Weight 06/21/22 1342 (!) 382 lb 0.9 oz (173.3 kg)     Height 06/21/22 1342 '5\' 7"'$  (1.702 m)     Head Circumference --      Peak Flow --      Pain Score 06/21/22 1341 8     Pain Loc --      Pain Edu? --      Excl. in [place]? --    Most recent vital signs: Vitals:   06/21/22 1343 06/21/22 1419  BP: (!) 157/111 (!) 146/81  Pulse: 73 85  Resp: 17 17  Temp: 98.5 F (36.9 C)   SpO2: 96% 98%   General: Awake, oriented x4. CV:  Good peripheral perfusion.  Resp:  Normal effort.  Abd:  No distention.  Other:  Young adult morbidly obese Caucasian female laying in stretcher in no acute distress with a 2 cm x 1 cm vertical linear ulceration  overlying the midline of the anterior left shin with a small amount of mucopurulent drainage and surrounding erythema/induration.  Bedside ultrasound does not show any drainable fluid collection however does show cobblestoning concerning for cellulitis ED Results / Procedures / Treatments  Labs (all labs ordered are listed, but only abnormal results are displayed) Labs Reviewed  CBC - Abnormal; Notable for the following components:      Result Value   RBC 5.20 (*)    HCT 46.5 (*)    All other components within normal limits  AEROBIC CULTURE W GRAM STAIN (SUPERFICIAL SPECIMEN)  COMPREHENSIVE METABOLIC PANEL  LACTIC ACID, PLASMA   PROCEDURES: Critical Care performed: No Procedures MEDICATIONS ORDERED IN ED: Medications - No data to display IMPRESSION / MDM / [place] / ED COURSE  I reviewed the triage vital signs and the nursing notes.                             The patient is on the cardiac monitor to evaluate for evidence of arrhythmia and/or significant heart rate changes. Patient's presentation is most consistent with acute presentation with potential threat to life or bodily  function. Presentation most consistent with simple cellulitis. Given History, Exam, and Workup I have low suspicion for Necrotizing Fasciitis, Abscess, Osteomyelitis, DVT or other emergent problem as a cause for this presentation.  Rx: Levofloxacin 500 mg once daily x10 days.  Per old notes and wound cultures, patient has had good resolution of symptoms with this antibiotic in the past as well as cultures showing consistent susceptibility  Disposition: Discharge. No evidence of serious bacterial illness. Nontoxic appearing, VSS. Low risk for treatment failure based on history. Strict return precautions discussed with patient with full understanding. Advised patient to follow up promptly with primary care provider within next 48 hours.   FINAL CLINICAL IMPRESSION(S) / ED DIAGNOSES   Final  diagnoses:  Cellulitis of left lower extremity  Left leg pain   Rx / DC Orders   ED Discharge Orders          Ordered    levofloxacin (LEVAQUIN) 500 MG tablet  Daily        06/21/22 1545           Note:  This document was prepared using Dragon voice recognition software and may include unintentional dictation errors.   Naaman Plummer, MD 06/21/22 [phone]

## 2022-06-21 NOTE — Telephone Encounter (Signed)
Let's see if she can come in Friday

## 2022-06-23 ENCOUNTER — Ambulatory Visit (INDEPENDENT_AMBULATORY_CARE_PROVIDER_SITE_OTHER): Payer: Commercial Managed Care - HMO | Admitting: Nurse Practitioner

## 2022-07-18 ENCOUNTER — Other Ambulatory Visit (INDEPENDENT_AMBULATORY_CARE_PROVIDER_SITE_OTHER): Payer: Commercial Managed Care - HMO | Admitting: Vascular Surgery

## 2022-07-25 ENCOUNTER — Encounter (INDEPENDENT_AMBULATORY_CARE_PROVIDER_SITE_OTHER): Payer: Commercial Managed Care - HMO

## 2022-08-03 ENCOUNTER — Telehealth (INDEPENDENT_AMBULATORY_CARE_PROVIDER_SITE_OTHER): Payer: Commercial Managed Care - HMO | Admitting: Internal Medicine

## 2022-08-03 ENCOUNTER — Encounter: Payer: Self-pay | Admitting: Internal Medicine

## 2022-08-03 VITALS — Resp 16 | Ht 67.0 in | Wt 376.0 lb

## 2022-08-03 DIAGNOSIS — J45909 Unspecified asthma, uncomplicated: Secondary | ICD-10-CM | POA: Diagnosis not present

## 2022-08-03 MED ORDER — PREDNISONE 10 MG PO TABS: ORAL_TABLET | ORAL | 0 refills | Status: DC

## 2022-08-03 MED ORDER — AZITHROMYCIN 250 MG PO TABS: ORAL_TABLET | ORAL | 0 refills | Status: DC

## 2022-08-03 NOTE — Progress Notes (Addendum)
[facility] [place], [place] [address]  Internal MEDICINE  Telephone Visit  Patient Name: Betty Fisher  [id number]  [id number]  Date of Service: 08/03/2022  I connected with the patient at 330 pm by telephone and verified the patients identity using two identifiers.   I discussed the limitations, risks, security and privacy concerns of performing an evaluation and management service by telephone and the availability of in person appointments. I also discussed with the patient that there may be a patient responsible charge related to the service.  The patient expressed understanding and agrees to proceed.    Chief Complaint  Patient presents with   Telephone Assessment    [phone] virtual visit   Telephone Screen   Cough    Started Friday, has congestion with green mucus, sinus HA, started today.  Took 5 covid test, fri, mon, tues, wed, today and all are negative.   Sore Throat    Started Friday feels from the cough    HPI Pt is connected via tele visit Coughing since Friday, sinus headache, negative for COVID 2.   Sore throat when coughs  3.  Develops Wheezing and chest tightness     Current Medication: Outpatient Encounter Medications as of 08/03/2022  Medication Sig   aspirin 81 MG EC tablet Take 81 mg by mouth daily. Swallow whole.   azithromycin (ZITHROMAX) 250 MG tablet Use as directed   ibuprofen (ADVIL) 800 MG tablet Take 1 tablet (800 mg total) by mouth every 8 (eight) hours as needed. (Patient taking differently: Take 800 mg by mouth every 8 (eight) hours as needed for cramping (menstrual pain relief).)   levothyroxine (SYNTHROID) 75 MCG tablet Take 1 tablet (75 mcg total) by mouth daily. (Patient taking differently: Take 75 mcg by mouth daily before breakfast.)   predniSONE (DELTASONE) 10 MG tablet Take one tab 3 x day for 3 days, then take one tab 2 x a day for 3 days and then take one tab a day for 3 days for copd   No  facility-administered encounter medications on file as of 08/03/2022.    Surgical History: Past Surgical History:  Procedure Laterality Date   INCISION AND DRAINAGE ABSCESS Left 05/25/2021   Procedure: INCISION AND DRAINAGE SEROMA;  Surgeon: Katha Cabal, MD;  Location: [facility] ORS;  Service: Vascular;  Laterality: Left;   WISDOM TOOTH EXTRACTION      Medical History: Past Medical History:  Diagnosis Date   Vitamin D deficiency     Family History: Family History  Problem Relation Age of Onset   Hypertension Mother    Hypothyroidism Mother    Hypertension Father     Social History   Socioeconomic History   Marital status: Single    Spouse name: Not on file   Number of children: Not on file   Years of education: Not on file   Highest education level: Not on file  Occupational History   Not on file  Tobacco Use   Smoking status: Never   Smokeless tobacco: Never  Vaping Use   Vaping Use: Never used  Substance and Sexual Activity   Alcohol use: Yes    Alcohol/week: 1.0 standard drink of alcohol    Types: 1 Glasses of wine per week    Comment: occationally   Drug use: No   Sexual activity: Not Currently    Birth control/protection: None  Other Topics Concern   Not on file  Social History Narrative   Not  on file   Social Determinants of Health   Financial Resource Strain: Not on file  Food Insecurity: Not on file  Transportation Needs: Not on file  Physical Activity: Not on file  Stress: Not on file  Social Connections: Not on file  Intimate Partner Violence: Not on file      Review of Systems  HENT:  Positive for rhinorrhea and sore throat.   Respiratory:  Positive for cough.     Vital Signs: Resp 16   Ht '5\' 7"'$  (1.702 m)   Wt (!) 376 lb (170.6 kg)   BMI 58.89 kg/m    Observation/Objective: Pt is hoarse during conversation but NAD     Assessment/Plan: 1. Acute asthmatic bronchitis Will start therapy as prescribed  - azithromycin  (ZITHROMAX) 250 MG tablet; Use as directed  Dispense: 6 tablet; Refill: 0 - predniSONE (DELTASONE) 10 MG tablet; Take one tab 3 x day for 3 days, then take one tab 2 x a day for 3 days and then take one tab a day for 3 days for copd  Dispense: 18 tablet; Refill: 0   General Counseling: Betty Fisher verbalizes understanding of the findings of today's phone visit and agrees with plan of treatment. I have discussed any further diagnostic evaluation that may be needed or ordered today. We also reviewed her medications today. she has been encouraged to call the office with any questions or concerns that should arise related to todays visit.    No orders of the defined types were placed in this encounter.   Meds ordered this encounter  Medications   azithromycin (ZITHROMAX) 250 MG tablet    Sig: Use as directed    Dispense:  6 tablet    Refill:  0   predniSONE (DELTASONE) 10 MG tablet    Sig: Take one tab 3 x day for 3 days, then take one tab 2 x a day for 3 days and then take one tab a day for 3 days for copd    Dispense:  18 tablet    Refill:  0    Time spent:10 Minutes    Dr Lavera Guise Internal medicine

## 2022-08-15 ENCOUNTER — Ambulatory Visit (INDEPENDENT_AMBULATORY_CARE_PROVIDER_SITE_OTHER): Payer: Commercial Managed Care - HMO | Admitting: Vascular Surgery

## 2022-08-24 ENCOUNTER — Telehealth (INDEPENDENT_AMBULATORY_CARE_PROVIDER_SITE_OTHER): Payer: Self-pay

## 2022-08-24 ENCOUNTER — Encounter (INDEPENDENT_AMBULATORY_CARE_PROVIDER_SITE_OTHER): Payer: Self-pay | Admitting: Vascular Surgery

## 2022-08-24 NOTE — Telephone Encounter (Signed)
Xanax 0.'5mg'$  #2 for laser ablation prescription was left on [facility] voicemail.

## 2022-08-28 ENCOUNTER — Encounter (INDEPENDENT_AMBULATORY_CARE_PROVIDER_SITE_OTHER): Payer: Self-pay

## 2022-08-29 ENCOUNTER — Encounter: Payer: Commercial Managed Care - HMO | Attending: Physician Assistant | Admitting: Physician Assistant

## 2022-08-29 DIAGNOSIS — I87312 Chronic venous hypertension (idiopathic) with ulcer of left lower extremity: Secondary | ICD-10-CM | POA: Diagnosis present

## 2022-08-29 DIAGNOSIS — L97822 Non-pressure chronic ulcer of other part of left lower leg with fat layer exposed: Secondary | ICD-10-CM | POA: Diagnosis not present

## 2022-08-29 DIAGNOSIS — Z6841 Body Mass Index (BMI) 40.0 and over, adult: Secondary | ICD-10-CM | POA: Diagnosis not present

## 2022-08-29 NOTE — Progress Notes (Signed)
ROXI, HLAVATY ([id number]) [id number] Page 1 of 5 Visit Report for 08/29/2022 Abuse Risk Screen Details Patient Name: Date of Service: Betty Fisher 08/29/2022 8:45 A M Medical Record Number: [id number] Patient Account Number: [id number] Date of Birth/Sex: Treating RN: 02/16/1996 (26 y.o. Female) [name] Primary Care Philamena Kramar: [name] Other Clinician: Referring Fitzpatrick Alberico: Treating Evens Meno/Extender: [name] Self, Referral Weeks in Treatment: 0 Abuse Risk Screen Items Answer ABUSE RISK SCREEN: Has anyone close to you tried to hurt or harm you recentlyo No Do you feel uncomfortable with anyone in your familyo No Has anyone forced you do things that you didnt want to doo No Electronic Signature(s) Signed: 08/29/2022 9:19:06 AM By: [name] RN Entered By: [name] on 08/29/2022 08:54:36 -------------------------------------------------------------------------------- Activities of Daily Living Details Patient Name: Date of Service: Betty Fisher 08/29/2022 8:45 A M Medical Record Number: [id number] Patient Account Number: [id number] Date of Birth/Sex: Treating RN: [birth year] (26 y.o. Female) [name] Primary Care Lisa-Marie Rueger: [name] Other Clinician: Referring Ildefonso Keaney: Treating Tilton Marsalis/Extender: [name] Self, Referral Weeks in Treatment: 0 Activities of Daily Living Items Answer Activities of Daily Living (Please select one for each item) Drive Automobile Completely Able T Medications ake Completely Able Use T elephone Completely Able Care for Appearance Completely Able Use T oilet Completely Able Bath / Shower Completely Able Dress Self Completely Able Feed Self Completely Able Walk Completely Able Get In / Out Bed Completely Able Housework Completely NANNETTE, ZILL ([id number]) [id number] Page 2 of 5 Prepare Meals Completely Able Handle Money Completely  Able Shop for Self Completely Able Electronic Signature(s) Signed: 08/29/2022 9:19:06 AM By: [name] RN Entered By: [name] on 08/29/2022 08:54:57 -------------------------------------------------------------------------------- Education Screening Details Patient Name: Date of Service: Betty Fisher. 08/29/2022 8:45 A M Medical Record Number: [id number] Patient Account Number: [id number] Date of Birth/Sex: Treating RN: [birth year] (26 y.o. Female) [name] Primary Care Saphia Vanderford: [name] Other Clinician: Referring Jenner Rosier: Treating Yailin Biederman/Extender: [name] Self, Referral Weeks in Treatment: 0 Primary Learner Assessed: Patient Learning Preferences/Education Level/Primary Language Learning Preference: Explanation Highest Education Level: College or Above Preferred Language: English Cognitive Barrier Language Barrier: No Translator Needed: No Memory Deficit: No Emotional Barrier: No Cultural/Religious Beliefs Affecting Medical Care: No Physical Barrier Impaired Vision: Yes Glasses Impaired Hearing: No Decreased Hand dexterity: No Knowledge/Comprehension Knowledge Level: Medium Comprehension Level: High Ability to understand written instructions: High Ability to understand verbal instructions: High Motivation Anxiety Level: Anxious Cooperation: Cooperative Education Importance: Acknowledges Need Interest in Health Problems: Asks Questions Perception: Coherent Willingness to Engage in Self-Management High Activities: Readiness to Engage in Self-Management High Activities: Electronic Signature(s) Signed: 08/29/2022 9:19:06 AM By: [name] RN Entered By: [name] on 08/29/2022 08:55:25 [name] ([id number]) [id number] Page 3 of 5 -------------------------------------------------------------------------------- Fall Risk Assessment Details Patient Name: Date of Service: Betty Fisher  08/29/2022 8:45 A M Medical Record Number: [id number] Patient Account Number: [id number] Date of Birth/Sex: Treating RN: 1996/06/21 (26 y.o. Female) [name] Primary Care Vearl Allbaugh: [name] Other Clinician: Referring Makhya Arave: Treating Cailan General/Extender: [name] Self, Referral Weeks in Treatment: 0 Fall Risk Assessment Items Have you had 2 or more falls in the last 12 monthso 0 No Have you had any fall that resulted in injury in the last 12 monthso 0 No FALLS RISK SCREEN History of falling - immediate or within 3 months 0 No Secondary diagnosis (Do you have 2 or  more medical diagnoseso) 0 No Ambulatory aid None/bed rest/wheelchair/nurse 0 No Crutches/cane/walker 0 No Furniture 0 No Intravenous therapy Access/Saline/Heparin Lock 0 No Gait/Transferring Normal/ bed rest/ wheelchair 0 No Weak (short steps with or without shuffle, stooped but able to lift head while walking, may seek 0 No support from furniture) Impaired (short steps with shuffle, may have difficulty arising from chair, head down, impaired 0 No balance) Mental Status Oriented to own ability 0 No Electronic Signature(s) Signed: 08/29/2022 9:19:06 AM By: [name] RN Entered By: [name] on 08/29/2022 08:55:33 -------------------------------------------------------------------------------- Foot Assessment Details Patient Name: Date of Service: Betty Fisher. 08/29/2022 8:45 A M Medical Record Number: [id number] Patient Account Number: [id number] Date of Birth/Sex: Treating RN: 1996/01/11 (26 y.o. Female) [name] Primary Care Bruno Leach: [name] Other Clinician: Referring Mariajose Mow: Treating Ernst Cumpston/Extender: [name] Self, Referral Weeks in Treatment: 0 Foot Assessment Items Site Locations [place], [place] ([id number]) [phone] [id number] Page 4 of 5 + = Sensation present, - = Sensation absent, C = Callus, U = Ulcer R = Redness, W = Warmth, M = Maceration,  PU = Pre-ulcerative lesion F = Fissure, S = Swelling, D = Dryness Assessment Right: Left: Other Deformity: No No Prior Foot Ulcer: No No Prior Amputation: No No Charcot Joint: No No Ambulatory Status: Ambulatory Without Help Gait: Steady Electronic Signature(s) Signed: 08/29/2022 9:19:06 AM By: [name] RN Entered By: [name] on 08/29/2022 08:56:17 -------------------------------------------------------------------------------- Nutrition Risk Screening Details Patient Name: Date of Service: Betty Fisher 08/29/2022 8:45 A M Medical Record Number: [id number] Patient Account Number: [id number] Date of Birth/Sex: Treating RN: 03/15/1996 (26 y.o. Female) [name] Primary Care Caroleann Casler: [name] Other Clinician: Referring Shana Younge: Treating Kindle Strohmeier/Extender: [name] Self, Referral Weeks in Treatment: 0 Height (in): 67 Weight (lbs): 368 Body Mass Index (BMI): 57.6 Nutrition Risk Screening Items Score Screening NUTRITION RISK SCREEN: I have an illness or condition that made me change the kind and/or amount of food I eat 0 No I eat fewer than two meals per day 0 No I eat few fruits and vegetables, or milk products 0 No I have three or more drinks of beer, liquor or wine almost every day 0 No I have tooth or mouth problems that make it hard for me to eat 0 No I don't always have enough money to buy the food I need 0 No LATOYIA, TECSON ([id number]) [id number] Page 5 of 5 I eat alone most of the time 0 No I take three or more different prescribed or over-the-counter drugs a day 1 Yes Without wanting to, I have lost or gained 10 pounds in the last six months 0 No I am not always physically able to shop, cook and/or feed myself 0 No Nutrition Protocols Good Risk Protocol 0 No interventions needed Moderate Risk Protocol High Risk Proctocol Risk Level: Good Risk Score: 1 Electronic Signature(s) Signed: 08/29/2022 9:19:06 AM  By: [name] RN Entered By: [name] on 08/29/2022 08:56:05

## 2022-08-29 NOTE — Progress Notes (Addendum)
Betty, Fisher ([id number]) [id number] Page 1 of 7 Visit Report for 08/29/2022 Allergy List Details Patient Name: Date of Service: Betty Fisher 08/29/2022 8:45 A M Medical Record Number: [id number] Patient Account Number: [id number] Date of Birth/Sex: Treating RN: 11/02/1995 (26 y.o. [name] Primary Care [name]: [name] Other Clinician: Referring [name]: Treating [name]: [name] Self, Referral Weeks in Treatment: 0 Allergies Active Allergies No Known Drug Allergies Allergy Notes Electronic Signature(s) Signed: 08/29/2022 9:19:06 AM By: [name] RN Entered By: [name] on 08/29/2022 08:53:55 -------------------------------------------------------------------------------- Arrival Information Details Patient Name: Date of Service: Betty Fisher. 08/29/2022 8:45 A M Medical Record Number: [id number] Patient Account Number: [id number] Date of Birth/Sex: Treating RN: 29-Jul-1996 (26 y.o. [name] Primary Care Dontre Laduca: [name] Other Clinician: Referring Curlie Sittner: Treating Genice Kimberlin/Extender: [name] Self, Referral Weeks in Treatment: 0 Visit Information Patient Arrived: Ambulatory Arrival Time: 08:46 Accompanied By: self Transfer Assistance: None Patient Identification Verified: Yes Secondary Verification Process Completed: Yes Patient Requires Transmission-Based Precautions: No Patient Has Alerts: Yes Patient Alerts: ABI L .97 02/06/22 [name], [name] ([id number]) Electronic Signature(s) Signed: 08/29/2022 9:19:06 AM By: [name] RN Entered By: [name] History Since Last Visit All ordered tests and consults were completed: No Added or deleted any medications: No Any new allergies or adverse reactions: No Had a fall or experienced change in activities of daily living that may affect risk of falls: No Signs or symptoms of abuse/neglect since last visito No Hospitalized since last  visit: No Implantable device outside of the clinic excluding cellular tissue based products placed in the center since last visit: No Pain Present Now: Yes [id number] Page 2 of 7 rie on 08/29/2022 09:02:24 -------------------------------------------------------------------------------- Clinic Level of Care Assessment Details Patient Name: Date of Service: Betty Fisher 08/29/2022 8:45 A M Medical Record Number: [id number] Patient Account Number: [id number] Date of Birth/Sex: Treating RN: 1996-04-17 (26 y.o. [name] Primary Care Dahiana Kulak: [name] Other Clinician: Referring Marlisha Vanwyk: Treating Machel Violante/Extender: [name] Self, Referral Weeks in Treatment: 0 Clinic Level of Care Assessment Items TOOL 4 Quantity Score X- 1 0 Use when only an EandM is performed on FOLLOW-UP visit ASSESSMENTS - Nursing Assessment / Reassessment X- 1 10 Reassessment of Co-morbidities (includes updates in patient status) X- 1 5 Reassessment of Adherence to Treatment Plan ASSESSMENTS - Wound and Skin A ssessment / Reassessment '[]'$  - 0 Simple Wound Assessment / Reassessment - one wound '[]'$  - 0 Complex Wound Assessment / Reassessment - multiple wounds '[]'$  - 0 Dermatologic / Skin Assessment (not related to wound area) ASSESSMENTS - Focused Assessment '[]'$  - 0 Circumferential Edema Measurements - multi extremities '[]'$  - 0 Nutritional Assessment / Counseling / Intervention '[]'$  - 0 Lower Extremity Assessment (monofilament, tuning fork, pulses) '[]'$  - 0 Peripheral Arterial Disease Assessment (using hand held doppler) ASSESSMENTS - Ostomy and/or Continence Assessment and Care '[]'$  - 0 Incontinence Assessment and Management '[]'$  - 0 Ostomy Care Assessment and Management (repouching, etc.) PROCESS - Coordination of Care X - Simple Patient / Family Education for ongoing care 1 15 '[]'$  - 0 Complex (extensive) Patient / Family Education for ongoing care X- 1 10 Staff obtains  Programmer, systems, Records, T Results / Process Orders est '[]'$  - 0 Staff telephones HHA, Nursing Homes / Clarify orders / etc '[]'$  - 0 Routine Transfer to another Facility (non-emergent condition) '[]'$  - 0 Routine Hospital Admission (non-emergent condition) X- 1 15 New Admissions / Biomedical engineer /  Ordering NPWT Apligraf, etc. , '[]'$  - 0 Emergency Hospital Admission (emergent condition) X- 1 10 Simple Discharge Coordination '[]'$  - 0 Complex (extensive) Discharge Coordination PROCESS - Special Needs ILIANI, VEJAR ([id number]) [id number] Page 3 of 7 '[]'$  - 0 Pediatric / Minor Patient Management '[]'$  - 0 Isolation Patient Management '[]'$  - 0 Hearing / Language / Visual special needs '[]'$  - 0 Assessment of Community assistance (transportation, D/C planning, etc.) '[]'$  - 0 Additional assistance / Altered mentation '[]'$  - 0 Support Surface(s) Assessment (bed, cushion, seat, etc.) INTERVENTIONS - Wound Cleansing / Measurement '[]'$  - 0 Simple Wound Cleansing - one wound '[]'$  - 0 Complex Wound Cleansing - multiple wounds '[]'$  - 0 Wound Imaging (photographs - any number of wounds) '[]'$  - 0 Wound Tracing (instead of photographs) '[]'$  - 0 Simple Wound Measurement - one wound '[]'$  - 0 Complex Wound Measurement - multiple wounds INTERVENTIONS - Wound Dressings '[]'$  - 0 Small Wound Dressing one or multiple wounds '[]'$  - 0 Medium Wound Dressing one or multiple wounds '[]'$  - 0 Large Wound Dressing one or multiple wounds '[]'$  - 0 Application of Medications - topical '[]'$  - 0 Application of Medications - injection INTERVENTIONS - Miscellaneous '[]'$  - 0 External ear exam '[]'$  - 0 Specimen Collection (cultures, biopsies, blood, body fluids, etc.) '[]'$  - 0 Specimen(s) / Culture(s) sent or taken to Lab for analysis '[]'$  - 0 Patient Transfer (multiple staff / Civil Service fast streamer / Similar devices) '[]'$  - 0 Simple Staple / Suture removal (25 or less) '[]'$  - 0 Complex Staple / Suture removal (26 or more) '[]'$  -  0 Hypo / Hyperglycemic Management (close monitor of Blood Glucose) '[]'$  - 0 Ankle / Brachial Index (ABI) - do not check if billed separately X- 1 5 Vital Signs Has the patient been seen at the hospital within the last three years: Yes Total Score: 70 Level Of Care: New/Established - Level 2 Electronic Signature(s) Signed: 08/29/2022 3:14:27 PM By: [name] RN Entered By: [name] on 08/29/2022 09:28:06 -------------------------------------------------------------------------------- Encounter Discharge Information Details Patient Name: Date of Service: Betty Lay N. 08/29/2022 8:45 A M Medical Record Number: [id number] Patient Account Number: [id number] MAKALYA, NAVE ([id number]) [id number] Page 4 of 7 Date of Birth/Sex: Treating RN: January 01, 1996 (26 y.o. [name] Primary Care Scott Fix: Other Clinician: [name] Referring Loras Grieshop: Treating Jarold Macomber/Extender: [name] Self, Referral Weeks in Treatment: 0 Encounter Discharge Information Items Discharge Condition: Stable Ambulatory Status: Ambulatory Discharge Destination: Home Transportation: Private Auto Accompanied By: self Schedule Follow-up Appointment: Yes Clinical Summary of Care: Electronic Signature(s) Signed: 08/29/2022 3:14:27 PM By: [name] RN Entered By: [name] on 08/29/2022 09:28:42 -------------------------------------------------------------------------------- Lower Extremity Assessment Details Patient Name: Date of Service: Betty Fisher 08/29/2022 8:45 A M Medical Record Number: [id number] Patient Account Number: [id number] Date of Birth/Sex: Treating RN: Mar 15, 1996 (26 y.o. [name] Primary Care Saniyya Gau: [name] Other Clinician: Referring Latisha Lasch: Treating Lamisha Roussell/Extender: [name] Self, Referral Weeks in Treatment: 0 Edema Assessment Assessed: [Left: No] [Right: No] Edema: [Left: Ye] [Right: s] Calf Left:  Right: Point of Measurement: 35 cm From Medial Instep 54.5 cm Ankle Left: Right: Point of Measurement: 12 cm From Medial Instep 30 cm Knee To Floor Left: Right: From Medial Instep 45 cm Vascular Assessment Pulses: Dorsalis Pedis Palpable: [Left:Yes] Electronic Signature(s) Signed: 08/29/2022 9:19:06 AM By: [name] RN Entered By: [name] on 08/29/2022 09:01:58 Betty Fisher ([id number]) [id number] Page 5 of 7 -------------------------------------------------------------------------------- Multi Wound Chart  Details Patient Name: Date of Service: Betty Fisher 08/29/2022 8:45 A M Medical Record Number: [id number] Patient Account Number: [id number] Date of Birth/Sex: Treating RN: 1996-07-30 (26 y.o. [name] Primary Care Channing Yeager: [name] Other Clinician: Referring Omeed Osuna: Treating Audreena Sachdeva/Extender: [name] Self, Referral Weeks in Treatment: 0 Vital Signs Height(in): 67 Pulse(bpm): 92 Weight(lbs): 368 Blood Pressure(mmHg): 172/99 Body Mass Index(BMI): 57.6 Temperature(F): 98.3 Respiratory Rate(breaths/min): 18 [Treatment Notes:Wound Assessments Treatment Notes] Electronic Signature(s) Signed: 08/29/2022 3:14:27 PM By: [name] RN Entered By: [name] on 08/29/2022 09:27:19 -------------------------------------------------------------------------------- Multi-Disciplinary Care Plan Details Patient Name: Date of Service: Betty Fisher. 08/29/2022 8:45 A M Medical Record Number: [id number] Patient Account Number: [id number] Date of Birth/Sex: Treating RN: 27-Nov-1995 (26 y.o. [name] Primary Care Sandeep Delagarza: [name] Other Clinician: Referring Willamina Grieshop: Treating Zharia Conrow/Extender: [name] Self, Referral Weeks in Treatment: 0 Active Inactive Electronic Signature(s) Signed: 08/29/2022 3:14:27 PM By: [name] RN Entered By: [name] on 08/29/2022 09:29:43 Betty Fisher  ([id number]) [id number] Page 6 of 7 -------------------------------------------------------------------------------- Pain Assessment Details Patient Name: Date of Service: Betty Fisher 08/29/2022 8:45 A M Medical Record Number: [id number] Patient Account Number: [id number] Date of Birth/Sex: Treating RN: 1996-09-26 (26 y.o. [name] Primary Care Torunn Chancellor: [name] Other Clinician: Referring Darnella Zeiter: Treating Marley Charlot/Extender: [name] Self, Referral Weeks in Treatment: 0 Active Problems Location of Pain Severity and Description of Pain Patient Has Paino No Site Locations With Dressing Change: No Duration of the Pain. Constant / Intermittento Constant Rate the pain. Current Pain Level: 5 Worst Pain Level: 9 Least Pain Level: 2 Tolerable Pain Level: 5 Character of Pain Describe the Pain: Aching, Burning Pain Management and Medication Current Pain Management: Medication: Yes Cold Application: No Rest: Yes Massage: No Activity: No T.E.FisherS.: No Heat Application: No Leg drop or elevation: No Is the Current Pain Management Adequate: Inadequate How does your wound impact your activities of daily livingo Sleep: Yes Bathing: No Appetite: No Relationship With Others: No Bladder Continence: No Emotions: No Bowel Continence: No Work: No Toileting: No Drive: No Dressing: No Hobbies: No Electronic Signature(s) Signed: 08/29/2022 9:19:06 AM By: [name] RN Entered By: [name] on 08/29/2022 08:53:18 Betty Fisher ([id number]) [id number] Page 7 of 7 -------------------------------------------------------------------------------- Patient/Caregiver Education Details Patient Name: Date of Service: Betty Fisher 10/31/2023andnbsp8:45 A M Medical Record Number: [id number] Patient Account Number: [id number] Date of Birth/Gender: Treating RN: November 17, 1995 (26 y.o. [name] Primary Care  Physician: [name] Other Clinician: Referring Physician: Treating Physician/Extender: [name] Self, Referral Weeks in Treatment: 0 Education Assessment Education Provided To: Patient Education Topics Provided Wound/Skin Impairment: Methods: Explain/Verbal Responses: State content correctly Electronic Signature(s) Signed: 08/29/2022 3:14:27 PM By: [name] RN Entered By: [name] on 08/29/2022 09:28:18 -------------------------------------------------------------------------------- [employer] Details Patient Name: Date of Service: Betty Fisher. 08/29/2022 8:45 A M Medical Record Number: [id number] Patient Account Number: [id number] Date of Birth/Sex: Treating RN: Dec 23, 1995 (26 y.o. [name] Primary Care Arther Heisler: [name] Other Clinician: Referring Dennis Killilea: Treating Genea Rheaume/Extender: [name] Self, Referral Weeks in Treatment: 0 Vital Signs Time Taken: 08:53 Temperature (F): 98.3 Height (in): 67 Pulse (bpm): 92 Source: Stated Respiratory Rate (breaths/min): 18 Weight (lbs): 368 Blood Pressure (mmHg): 172/99 Source: Stated Reference Range: 80 - 120 mg / dl Body Mass Index (BMI): 57.6 Electronic Signature(s) Signed: 08/29/2022 9:19:06 AM By: [name] RN Entered By: [name] on 08/29/2022 08:53:46

## 2022-08-29 NOTE — Progress Notes (Signed)
FEBE, CHAMPA ([id number]) [id number] Page 1 of 7 Visit Report for 08/29/2022 Chief Complaint Document Details Patient Name: Date of Service: Betty Fisher 08/29/2022 8:45 A M Medical Record Number: [id number] Patient Account Number: [id number] Date of Birth/Sex: Treating RN: 10-09-1996 (26 y.o. [name] Primary Care Provider: [name] Other Clinician: Referring Provider: Treating Provider/Extender: [name] Self, Referral Weeks in Treatment: 0 Information Obtained from: Patient Chief Complaint Left LE Ulcer Electronic Signature(s) Signed: 08/29/2022 9:14:19 AM By: Worthy Keeler PA-C Entered By: Worthy Keeler on 08/29/2022 09:14:19 -------------------------------------------------------------------------------- HPI Details Patient Name: Date of Service: Betty Fisher. 08/29/2022 8:45 A M Medical Record Number: [id number] Patient Account Number: [id number] Date of Birth/Sex: Treating RN: 06-20-1996 (26 y.o. [name] Primary Care Provider: [name] Other Clinician: Referring Provider: Treating Provider/Extender: [name] Self, Referral Weeks in Treatment: 0 History of Present Illness HPI Description: 02-06-2022 upon evaluation today patient presents for initial inspection here in our clinic concerning an issue she has been having with her leg. This has been going on for quite a while. In fact her incision and drainage surgery was actually performed on September 2022. Subsequently since that time she has been trying to get this to heal and it would heal sometimes scabbing over and seem to be doing okay and then it would reopen again and this is been back and forth. Her leg is also been significantly swollen intermittently today is not too bad there is some days when it is much much worse. Actually did review some of the pictures she submitted through epic and it showed a much more significantly swollen leg which  actually I think may be part of the reason why this is not wanting to heal as well as it should. Subsequently she is also having greenish drainage coming from the wound she tells me she is unsure why. She was on Bactrim for 2 to 3 weeks but has been off of that for several days at this point. She did have an ultrasound on December 23, 2021 due to concerns about this worsening and it was negative for any signs of abscess. The patient does seem to have a history of potential chronic venous insufficiency. Again as we discussed her weight may have some to do with this and losing weight could help but at the same time I am also concerned about the fact that she has had an injury here and it may just be some irritation in this leg causing more significant swelling than what would otherwise normally be expected. 02-21-2022 upon evaluation today patient's wound actually showing signs of excellent improvement and actually very pleased with where we stand today. There does not appear to be any evidence of infection which is great news and overall I think that we are on the right track here. In fact she may be almost completely closed although I still see some area where there is some weeping through where the original wound was. Betty Fisher, Betty Fisher ([id number]) [id number] Page 2 of 7 02-28-2022 upon evaluation today patient appears to be doing well with regard to her left leg. I definitely see signs of good improvement which is great news and overall very pleased with where we stand at this point. I do not see any evidence of active infection locally or systemically which is great news and overall I think that we are headed in the right direction here. 03-07-2022 upon evaluation today patient actually appears to  be completely healed which is great news and overall very pleased with where we stand today. There does not appear to be any evidence of active infection at this  time. Readmission: 04-06-2022 patient presents for reevaluation here in the clinic unfortunately she has been having issues since she returned from the beach with infection seemingly over the left lower extremity on the anterior portion where previously she had a wound that we completely healed. Since I saw her last she has not been wearing compression despite the fact that we had had a conversation about this including even given her information to look on [place] for the Farrow wrap basic in order to help since I was not sure she would be able to use a standard compression stocking. Nonetheless the patient told [employer] [employer]ming[employer] when she checked and that nobody discussed compression with her. When I went in to see her however she admitted to having [place] following our previous discussion but states that "there were so many options that I did not know what to order". 04-14-2022 upon evaluation today patient appears to be doing very well in regard to her wound. She has been tolerating the dressing changes without complication and with the Tubigrip for compression this actually has done extremely well. I am very pleased with what I am seeing. 04-24-2022 upon evaluation today patient appears to be doing well currently in regard to her wound. She has been tolerating the dressing changes nothing is draining everything looks to be very good and is completely closed. The compression has made a big difference here. Readmission: 08-29-2022 upon evaluation today patient presents for readmission concerning issues that she has been having with her left lower extremity. She tells me this started to leak again and began to become infected and this was sometime around I believe she said September. With that being said it ended up being that the wound did not actually show signs of being open today although she tells me it still somewhat tender. She has been wearing her Velcro compression socks specifically the juxta  lite wraps. She tells me that she wears them every day without them she does not know that she doing be doing as well as she is. She eventually did end up in the ER where they treated her for the infection with what I previously treated her with medication wise and she tells me that seems to have pretty much cleared things up which is great news. Currently I do not see any evidence of infection at this time. She actually is set up for a venous ablation on Friday. Electronic Signature(s) Signed: 08/29/2022 2:56:37 PM By: Worthy Keeler PA-C Entered By: Worthy Keeler on 08/29/2022 14:56:37 -------------------------------------------------------------------------------- Physical Exam Details Patient Name: Date of Service: Betty Fisher 08/29/2022 8:45 A M Medical Record Number: [id number] Patient Account Number: [id number] Date of Birth/Sex: Treating RN: 1996-10-08 (26 y.o. [name] Primary Care Provider: [name] Other Clinician: Referring Provider: Treating Provider/Extender: [name] Self, Referral Weeks in Treatment: 0 Constitutional patient is hypertensive.. pulse regular and within target range for patient.Marland Kitchen respirations regular, non-labored and within target range for patient.Marland Kitchen temperature within target range for patient.. Obese and well-hydrated in no acute distress. Eyes conjunctiva clear no eyelid edema noted. pupils equal round and reactive to light and accommodation. Ears, Nose, Mouth, and Throat no gross abnormality of ear auricles or external auditory canals. normal hearing noted during conversation. mucus membranes moist. Respiratory normal breathing without difficulty. Cardiovascular  2+ dorsalis pedis/posterior tibialis pulses. 1+ pitting edema of the bilateral lower extremities. Musculoskeletal normal gait and posture. no significant deformity or arthritic changes, no loss or range of motion, no clubbing. Psychiatric this patient is able to make  decisions and demonstrates good insight into disease process. Alert and Oriented x 3. pleasant and cooperative. Betty Fisher, Betty Fisher ([id number]) [id number] Page 3 of 7 Notes Upon inspection patient's wound bed actually showed signs again of being healed I do not see anything open there is definitely scar tissue and when she was in the ER they actually did check her for an abscess as well and on ultrasound it was noted that she did have scar tissue present at that time as well. With that being said there was no abscess noted which is good news per the patient have not independently reviewed this record as of right now. Nonetheless she does tell me that she has continued to wear compression stockings and she also tells me vascular is hopeful that when she has the ablation this will help a lot with what she has been experiencing. Electronic Signature(s) Signed: 08/29/2022 2:57:21 PM By: Worthy Keeler PA-C Entered By: Worthy Keeler on 08/29/2022 14:57:21 -------------------------------------------------------------------------------- Physician Orders Details Patient Name: Date of Service: Betty Fisher. 08/29/2022 8:45 A M Medical Record Number: [id number] Patient Account Number: [id number] Date of Birth/Sex: Treating RN: 1995-12-29 (26 y.o. 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 Put garments on first thing when you wake up and remove them before bed. Wound  Treatment Electronic Signature(s) Signed: 08/29/2022 3:14:27 PM By: [name] RN Signed: 08/29/2022 4:04:00 PM By: Worthy Keeler PA-C Entered By: [name] on 08/29/2022 09:27:39 -------------------------------------------------------------------------------- Problem List Details Patient Name: Date of Service: Betty Fisher. 08/29/2022 8:45 A M Medical Record Number: [id number] Patient Account Number: [id number] Date of Birth/Sex: Treating RN: 11/29/1995 (26 y.o. [name] Primary Care Provider: [name] Other Clinician: Referring Provider: Treating Provider/Extender: [name] Self, Referral Betty Fisher ([id number]) [id number] Page 4 of 7 Weeks in Treatment: 0 Active Problems ICD-10 Encounter Code Description Active Date MDM Diagnosis I87.312 Chronic venous hypertension (idiopathic) with ulcer of left lower extremity 08/29/2022 No Yes L97.822 Non-pressure chronic ulcer of other part of left lower leg with fat layer 08/29/2022 No Yes exposed E66.01 Morbid (severe) obesity due to excess calories 08/29/2022 No Yes Inactive Problems Resolved Problems Electronic Signature(s) Signed: 08/29/2022 9:13:49 AM By: Worthy Keeler PA-C Entered By: Worthy Keeler on 08/29/2022 09:13:49 -------------------------------------------------------------------------------- Progress Note Details Patient Name: Date of Service: Betty Fisher. 08/29/2022 8:45 A M Medical Record Number: [id number] Patient Account Number: [id number] Date of Birth/Sex: Treating RN: [birth year] (26 y.o. [name] Primary Care Provider: [name] Other Clinician: Referring Provider: Treating Provider/Extender: [name] Self, Referral Weeks in Treatment: 0 Subjective Chief Complaint Information obtained from Patient Left LE Ulcer History of Present Illness (HPI) 02-06-2022 upon evaluation today patient presents for initial inspection here in our  clinic concerning an issue she has been having with her leg.  This has been going on for quite a while. In fact her incision and drainage surgery was actually performed on September 2022. Subsequently since that time she has been trying to get this to heal and it would heal sometimes scabbing over and seem to be doing okay and then it would reopen again and this is been back and forth. Her leg is also been significantly swollen intermittently today is not too bad there is some days when it is much much worse. Actually did review some of the pictures she submitted through epic and it showed a much more significantly swollen leg which actually I think may be part of the reason why this is not wanting to heal as well as it should. Subsequently she is also having greenish drainage coming from the wound she tells me she is unsure why. She was on Bactrim for 2 to 3 weeks but has been off of that for several days at this point. She did have an ultrasound on December 23, 2021 due to concerns about this worsening and it was negative for any signs of abscess. The patient does seem to have a history of potential chronic venous insufficiency. Again as we discussed her weight may have some to do with this and losing weight could help but at the same time I am also concerned about the fact that she has had an injury here and it may just be some irritation in this leg causing more significant swelling than what would otherwise normally be expected. 02-21-2022 upon evaluation today patient's wound actually showing signs of excellent improvement and actually very pleased with where we stand today. There does not appear to be any evidence of infection which is great news and overall I think that we are on the right track here. In fact she may be almost completely closed although I still see some area where there is some weeping through where the original wound was. 02-28-2022 upon evaluation today patient appears to be  doing well with regard to her left leg. I definitely see signs of good improvement which is great news and Betty Fisher, Betty Fisher ([id number]) [id number] Page 5 of 7 overall very pleased with where we stand at this point. I do not see any evidence of active infection locally or systemically which is great news and overall I think that we are headed in the right direction here. 03-07-2022 upon evaluation today patient actually appears to be completely healed which is great news and overall very pleased with where we stand today. There does not appear to be any evidence of active infection at this time. Readmission: 04-06-2022 patient presents for reevaluation here in the clinic unfortunately she has been having issues since she returned from the beach with infection seemingly over the left lower extremity on the anterior portion where previously she had a wound that we completely healed. Since I saw her last she has not been wearing compression despite the fact that we had had a conversation about this including even given her information to look on [place] for the Farrow wrap basic in order to help since I was not sure she would be able to use a standard compression stocking. Nonetheless the patient told [employer] [employer]ming[employer] when she checked and that nobody discussed compression with her. When I went in to see her however she admitted to having [place] following our previous discussion but states that "there were so many options that I did not know what to order". 04-14-2022 upon evaluation today  patient appears to be doing very well in regard to her wound. She has been tolerating the dressing changes without complication and with the Tubigrip for compression this actually has done extremely well. I am very pleased with what I am seeing. 04-24-2022 upon evaluation today patient appears to be doing well currently in regard to her wound. She has been tolerating the dressing changes nothing  is draining everything looks to be very good and is completely closed. The compression has made a big difference here. Readmission: 08-29-2022 upon evaluation today patient presents for readmission concerning issues that she has been having with her left lower extremity. She tells me this started to leak again and began to become infected and this was sometime around I believe she said September. With that being said it ended up being that the wound did not actually show signs of being open today although she tells me it still somewhat tender. She has been wearing her Velcro compression socks specifically the juxta lite wraps. She tells me that she wears them every day without them she does not know that she doing be doing as well as she is. She eventually did end up in the ER where they treated her for the infection with what I previously treated her with medication wise and she tells me that seems to have pretty much cleared things up which is great news. Currently I do not see any evidence of infection at this time. She actually is set up for a venous ablation on Friday. Patient History Information obtained from Patient. Allergies No Known Drug Allergies Social History Never smoker, Marital Status - Single, Alcohol Use - Rarely, Drug Use - No History, Caffeine Use - Moderate - coffee. Objective Constitutional patient is hypertensive.. pulse regular and within target range for patient.Marland Kitchen respirations regular, non-labored and within target range for patient.Marland Kitchen temperature within target range for patient.. Obese and well-hydrated in no acute distress. Vitals Time Taken: 8:53 AM, Height: 67 in, Source: Stated, Weight: 368 lbs, Source: Stated, BMI: 57.6, Temperature: 98.3 F, Pulse: 92 bpm, Respiratory Rate: 18 breaths/min, Blood Pressure: 172/99 mmHg. Eyes conjunctiva clear no eyelid edema noted. pupils equal round and reactive to light and accommodation. Ears, Nose, Mouth, and Throat no gross  abnormality of ear auricles or external auditory canals. normal hearing noted during conversation. mucus membranes moist. Respiratory normal breathing without difficulty. Cardiovascular 2+ dorsalis pedis/posterior tibialis pulses. 1+ pitting edema of the bilateral lower extremities. Musculoskeletal normal gait and posture. no significant deformity or arthritic changes, no loss or range of motion, no clubbing. Psychiatric this patient is able to make decisions and demonstrates good insight into disease process. Alert and Oriented x 3. pleasant and cooperative. General Notes: Upon inspection patient's wound bed actually showed signs again of being healed I do not see anything open there is definitely scar tissue and when she was in the ER they actually did check her for an abscess as well and on ultrasound it was noted that she did have scar tissue present at that time as well. With that being said there was no abscess noted which is good news per the patient have not independently reviewed this record as of right now. Nonetheless she does tell me that she has continued to wear compression stockings and she also tells me vascular is hopeful that when she has the ablation this will help a lot with what she has been experiencing. Betty Fisher, Betty Fisher ([id number]) [id number] Page 6 of 7 Assessment Active Problems ICD-10 Chronic  venous hypertension (idiopathic) with ulcer of left lower extremity Non-pressure chronic ulcer of other part of left lower leg with fat layer exposed Morbid (severe) obesity due to excess calories Plan Discharge From [facility] Services: Consult Only Wear compression garments daily. Put garments on first thing when you wake up and remove them before bed. 1. I am going to recommend that we have the patient continue with her Velcro compression socks for now although she is going to have some different stockings to use when she has the ablation that vascular  will talk to her about. 2. I am also can recommend that she should continue to monitor for any signs of infection or worsening if anything changes she knows to contact the office let me know otherwise of the skin is here for consult today. We will see her back for follow-up visit as needed. Electronic Signature(s) Signed: 08/29/2022 2:58:04 PM By: Worthy Keeler PA-C Entered By: Worthy Keeler on 08/29/2022 14:58:04 -------------------------------------------------------------------------------- ROS/PFSH Details Patient Name: Date of Service: Betty Fisher. 08/29/2022 8:45 A M Medical Record Number: [id number] Patient Account Number: [id number] Date of Birth/Sex: Treating RN: 12/08/1995 (26 y.o. [name] Primary Care Provider: [name] Other Clinician: Referring Provider: Treating Provider/Extender: [name] Self, Referral Weeks in Treatment: 0 Information Obtained From Patient Immunizations Pneumococcal Vaccine: Received Pneumococcal Vaccination: No Implantable Devices None Family and Social History Never smoker; Marital Status - Single; Alcohol Use: Rarely; Drug Use: No History; Caffeine Use: Moderate - coffee Electronic Signature(s) Signed: 08/29/2022 9:19:06 AM By: [name] RN Signed: 08/29/2022 4:04:00 PM By: Worthy Keeler PA-C Entered By: [name] on 08/29/2022 08:54:29 Betty Fisher ([id number]) [id number] Page 7 of 7 -------------------------------------------------------------------------------- SuperBill Details Patient Name: Date of Service: Betty Fisher 08/29/2022 Medical Record Number: [id number] Patient Account Number: [id number] Date of Birth/Sex: Treating RN: 19-Jan-1996 (26 y.o. 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 E66.01 Morbid (severe) obesity due to excess calories Facility Procedures : 7 CPT4 Code: [id number] Description: [id number] - WOUND CARE VISIT-LEV 2 EST PT Modifier: Quantity: 1 Physician Procedures : CPT4 Code Description Modifier [id number] - WC PHYS LEVEL 3 - EST PT ICD-10 Diagnosis Description I87.312 Chronic venous hypertension (idiopathic) with ulcer of left lower extremity L97.822 Non-pressure chronic ulcer of other part of left lower leg  with fat layer exposed E66.01 Morbid (severe) obesity due to excess calories Quantity: 1 Electronic Signature(s) Signed: 08/29/2022 2:59:16 PM By: Worthy Keeler PA-C Entered By: Worthy Keeler on 08/29/2022 14:59:16

## 2022-09-01 ENCOUNTER — Ambulatory Visit (INDEPENDENT_AMBULATORY_CARE_PROVIDER_SITE_OTHER): Payer: Commercial Managed Care - HMO | Admitting: Vascular Surgery

## 2022-09-01 ENCOUNTER — Encounter (INDEPENDENT_AMBULATORY_CARE_PROVIDER_SITE_OTHER): Payer: Self-pay | Admitting: Vascular Surgery

## 2022-09-01 VITALS — BP 153/90 | HR 84 | Resp 19 | Ht 67.0 in | Wt >= 6400 oz

## 2022-09-01 DIAGNOSIS — I83812 Varicose veins of left lower extremities with pain: Secondary | ICD-10-CM | POA: Insufficient documentation

## 2022-09-01 NOTE — Progress Notes (Signed)
  Betty Fisher is a 26 y.o. female who presents with symptomatic venous reflux  Past Medical History:  Diagnosis Date   Vitamin D deficiency     Past Surgical History:  Procedure Laterality Date   INCISION AND DRAINAGE ABSCESS Left 05/25/2021   Procedure: INCISION AND DRAINAGE SEROMA;  Surgeon: Katha Cabal, MD;  Location: [facility] ORS;  Service: Vascular;  Laterality: Left;   WISDOM TOOTH EXTRACTION       Current Outpatient Medications:    aspirin 81 MG EC tablet, Take 81 mg by mouth daily. Swallow whole., Disp: , Rfl:    azithromycin (ZITHROMAX) 250 MG tablet, Use as directed, Disp: 6 tablet, Rfl: 0   ibuprofen (ADVIL) 800 MG tablet, Take 1 tablet (800 mg total) by mouth every 8 (eight) hours as needed. (Patient taking differently: Take 800 mg by mouth every 8 (eight) hours as needed for cramping (menstrual pain relief).), Disp: 30 tablet, Rfl: 3   levothyroxine (SYNTHROID) 75 MCG tablet, Take 1 tablet (75 mcg total) by mouth daily. (Patient taking differently: Take 75 mcg by mouth daily before breakfast.), Disp: 90 tablet, Rfl: 3   predniSONE (DELTASONE) 10 MG tablet, Take one tab 3 x day for 3 days, then take one tab 2 x a day for 3 days and then take one tab a day for 3 days for copd, Disp: 18 tablet, Rfl: 0  No Known Allergies   Varicose veins of leg with pain, left   PLAN: The patient's left lower extremity was sterilely prepped and draped. The ultrasound machine was used to visualize the saphenous vein throughout its course. A segment in the upper calf was selected for access. The saphenous vein was accessed without difficulty using ultrasound guidance with a micropuncture needle. A 0.018 wire was then placed beyond the saphenofemoral junction and the needle was removed. The 65 cm sheath was then placed over the wire and the wire and dilator were removed. The laser fiber was then placed through the sheath and its tip was placed approximately 5 centimeters below the  saphenofemoral junction. Tumescent anesthesia was then created with a dilute lidocaine solution. Laser energy was then delivered with constant withdrawal of the sheath and laser fiber. Approximately 1388 joules of energy were delivered over a length of 33 centimeters using a 1470 Hz VenaCure machine at 7 W. Sterile dressings were placed. The patient tolerated the procedure well without obvious complications.   Follow-up in 1 week with post-laser duplex.

## 2022-09-04 ENCOUNTER — Encounter (INDEPENDENT_AMBULATORY_CARE_PROVIDER_SITE_OTHER): Payer: Self-pay | Admitting: Vascular Surgery

## 2022-09-05 ENCOUNTER — Emergency Department
Admission: EM | Admit: 2022-09-05 | Discharge: 2022-09-05 | Disposition: A | Discharge status: Home / Self Care | Payer: Commercial Managed Care - HMO | Attending: Emergency Medicine | Admitting: Emergency Medicine

## 2022-09-05 ENCOUNTER — Other Ambulatory Visit: Payer: Self-pay

## 2022-09-05 DIAGNOSIS — E876 Hypokalemia: Secondary | ICD-10-CM | POA: Diagnosis not present

## 2022-09-05 DIAGNOSIS — D473 Essential (hemorrhagic) thrombocythemia: Secondary | ICD-10-CM | POA: Diagnosis not present

## 2022-09-05 DIAGNOSIS — R21 Rash and other nonspecific skin eruption: Secondary | ICD-10-CM | POA: Diagnosis present

## 2022-09-05 LAB — COMPREHENSIVE METABOLIC PANEL
ALT: 27 U/L (ref 0–44)
AST: 22 U/L (ref 15–41)
Albumin: 4 g/dL (ref 3.5–5.0)
Alkaline Phosphatase: 84 U/L (ref 38–126)
Anion gap: 8 (ref 5–15)
BUN: 13 mg/dL (ref 6–20)
CO2: 26 mmol/L (ref 22–32)
Calcium: 8.8 mg/dL — ABNORMAL LOW (ref 8.9–10.3)
Chloride: 106 mmol/L (ref 98–111)
Creatinine, Ser: 0.97 mg/dL (ref 0.44–1.00)
GFR, Estimated: >60 mL/min (ref >60)
Glucose, Bld: 113 mg/dL — ABNORMAL HIGH (ref 70–99)
Potassium: 3.4 mmol/L — ABNORMAL LOW (ref 3.5–5.1)
Sodium: 140 mmol/L (ref 135–145)
Total Bilirubin: 0.7 mg/dL (ref 0.3–1.2)
Total Protein: 7.1 g/dL (ref 6.5–8.1)

## 2022-09-05 LAB — CBC WITH DIFFERENTIAL/PLATELET
Abs Immature Granulocytes: 0.02 10*3/uL (ref 0.00–0.07)
Basophils Absolute: 0 10*3/uL (ref 0.0–0.1)
Basophils Relative: 0 %
Eosinophils Absolute: 0.2 10*3/uL (ref 0.0–0.5)
Eosinophils Relative: 2 %
HCT: 43.3 % (ref 36.0–46.0)
Hemoglobin: 14.1 g/dL (ref 12.0–15.0)
Immature Granulocytes: 0 %
Lymphocytes Relative: 36 %
Lymphs Abs: 2.8 10*3/uL (ref 0.7–4.0)
MCH: 28.4 pg (ref 26.0–34.0)
MCHC: 32.6 g/dL (ref 30.0–36.0)
MCV: 87.3 fL (ref 80.0–100.0)
Monocytes Absolute: 0.5 10*3/uL (ref 0.1–1.0)
Monocytes Relative: 7 %
Neutro Abs: 4.2 10*3/uL (ref 1.7–7.7)
Neutrophils Relative %: 55 %
Platelets: 442 10*3/uL — ABNORMAL HIGH (ref 150–400)
RBC: 4.96 MIL/uL (ref 3.87–5.11)
RDW: 13.4 % (ref 11.5–15.5)
WBC: 7.7 10*3/uL (ref 4.0–10.5)
nRBC: 0 % (ref 0.0–0.2)

## 2022-09-05 MED ORDER — CEPHALEXIN 500 MG PO CAPS: 1000.0000 mg | ORAL_CAPSULE | Freq: Two times a day (BID) | ORAL | 0 refills | Status: AC

## 2022-09-05 NOTE — ED Notes (Signed)
E signature pad not working. Pt educated on discharge instructions and verbalized understanding.  

## 2022-09-05 NOTE — ED Provider Notes (Signed)
[facility] Provider Note    Event Date/Time   First MD Initiated Contact with Patient 09/05/22 2141     (approximate)   History   Chief Complaint Leg Pain   HPI Betty Fisher is a 26 y.o. female, history of morbid obesity, varicose veins, presents to the emergency department for evaluation of leg pain.  Patient states that she underwent a vein ablation this past Friday.  Since then she has developed increased redness from the insertion site of the wire that is extending into her groin.  Additionally states that she has a small 1 cm blister that has developed over the insertion site as well.  Reports some burning sensation along the rash, but otherwise no significant symptoms.  Denies fever/chills, myalgias, chest pain, shortness of breath, dysuria, cold sensation the affected extremity, numb/tingling the extremity, dizziness/lightheadedness, or headache.  History Limitations: No limitations.        Physical Exam  Triage Vital Signs: ED Triage Vitals  Enc Vitals Group     BP 09/05/22 2058 (!) 157/92     Pulse Rate 09/05/22 2058 82     Resp 09/05/22 2058 20     Temp 09/05/22 2058 99 F (37.2 C)     Temp Source 09/05/22 2058 Oral     SpO2 09/05/22 2058 95 %     Weight --      Height --      Head Circumference --      Peak Flow --      Pain Score 09/05/22 2059 5     Pain Loc --      Pain Edu? --      Excl. in [place]? --     Most recent vital signs: Vitals:   09/05/22 2058  BP: (!) 157/92  Pulse: 82  Resp: 20  Temp: 99 F (37.2 C)  SpO2: 95%    General: Awake, NAD.  Eyes: PERRL. Conjunctivae normal.  CV: Good peripheral perfusion.  Resp: Normal effort.  Abd: Soft, non-tender. No distention.  Neuro: At baseline. No gross neurological deficits.  Musculoskeletal: Normal ROM of all extremities.  Focused Exam: No gross deformities to the left lower extremity.  PMS intact distally.  There does appear to be a 1 cm blister with clear fluid  along the insertion site.  No active drainage at this time.  Erythematous rash present along the insertion site spreading along the medial aspect of the left lower extremity extending just below the groin.  Mild tenderness to palpation, no warmth.  No fluctuance or evidence of abscesses.  Physical Exam    ED Results / Procedures / Treatments  Labs (all labs ordered are listed, but only abnormal results are displayed) Labs Reviewed  CBC WITH DIFFERENTIAL/PLATELET - Abnormal; Notable for the following components:      Result Value   Platelets 442 (*)    All other components within normal limits  COMPREHENSIVE METABOLIC PANEL - Abnormal; Notable for the following components:   Potassium 3.4 (*)    Glucose, Bld 113 (*)    Calcium 8.8 (*)    All other components within normal limits     EKG N/A.    RADIOLOGY  ED Provider Interpretation: N/A.  No results found.  PROCEDURES:  Critical Care performed: N/A.  Procedures    MEDICATIONS ORDERED IN ED: Medications - No data to display   IMPRESSION / MDM / ASSESSMENT AND PLAN / ED COURSE  I reviewed the triage vital signs  and the nursing notes.                              Differential diagnosis includes, but is not limited to, cellulitis, abscess, blister, thrombophlebitis, contact dermatitis.  ED Course Patient appears well, vitals within normal limits for the patient.  NAD.  Afebrile.  CBC shows no leukocytosis, or anemia.  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  CMP shows mild hypokalemia at 3.4 and mild hypocalcemia at 8.8.  Otherwise no significant electrolyte abnormalities, AKI, or transaminitis.  Assessment/Plan Patient presents with rash along the left lower extremity extending towards the groin in the setting of recent vein ablation approximately 4 days prior.  No signs of any systemic infection.  Lab work-up is unremarkable.  Rash appears to be more a contact dermatitis, however she has had  frequent cellulitis infections in the past in that lower extremity.  We will provide her with a prescription for cephalexin to cover for potential infection.  Patient otherwise has no evidence of any serious or life-threatening pathology.  She additionally inquired about another repeating rash/wound that she has had chronically over the past several months after a surgery on her leg.  The wound care clinic states that because it is not an active wound, they do not have anything to offer her.  She requested another specialist to follow-up with this rash.  Provided with a referral to dermatology.  We will discharge.  Considered admission for this patient, but given her stable presentation and unremarkable work-up, she is unlikely benefit from admission.  Provided the patient with anticipatory guidance, return precautions, and educational material. Encouraged the patient to return to the emergency department at any time if they begin to experience any new or worsening symptoms. Patient expressed understanding and agreed with the plan.   Patient's presentation is most consistent with acute complicated illness / injury requiring diagnostic workup.       FINAL CLINICAL IMPRESSION(S) / ED DIAGNOSES   Final diagnoses:  Rash     Rx / DC Orders   ED Discharge Orders          Ordered    cephALEXin (KEFLEX) 500 MG capsule  2 times daily        09/05/22 2207             Note:  This document was prepared using Dragon voice recognition software and may include unintentional dictation errors.   Teodoro Spray, [place] 09/05/22 2216    Naaman Plummer, MD 09/05/22 [phone]

## 2022-09-05 NOTE — ED Triage Notes (Signed)
Patient reports having vein ablation Friday and now has new redness extending up to groin and blister slightly above wire insertion site. AOX4. Resp even, unlabored on RA. Denies increased swelling to leg. Ambulatory with steady gait.

## 2022-09-05 NOTE — Discharge Instructions (Addendum)
-  The blister will likely pop on its own.  Please keep the affected area covered with Band-Aid or gauze.  -Please take the full course of the cephalexin as prescribed in order to cover for potential infections.  -In regards to the other wound/rash at the bottom of your left leg, you may follow-up with the dermatology office listed in these instructions.  -Please return to the emergency department anytime if you begin to experience any new or worsening symptoms.

## 2022-09-07 ENCOUNTER — Encounter (INDEPENDENT_AMBULATORY_CARE_PROVIDER_SITE_OTHER): Payer: Self-pay | Admitting: Vascular Surgery

## 2022-09-08 ENCOUNTER — Encounter (INDEPENDENT_AMBULATORY_CARE_PROVIDER_SITE_OTHER): Payer: Self-pay | Admitting: Vascular Surgery

## 2022-09-08 ENCOUNTER — Encounter (INDEPENDENT_AMBULATORY_CARE_PROVIDER_SITE_OTHER): Payer: Commercial Managed Care - HMO

## 2022-09-08 ENCOUNTER — Other Ambulatory Visit (INDEPENDENT_AMBULATORY_CARE_PROVIDER_SITE_OTHER): Payer: Self-pay | Admitting: Vascular Surgery

## 2022-09-08 DIAGNOSIS — I83812 Varicose veins of left lower extremities with pain: Secondary | ICD-10-CM

## 2022-09-08 NOTE — Telephone Encounter (Signed)
All patient concerns has been answer

## 2022-09-11 ENCOUNTER — Encounter (INDEPENDENT_AMBULATORY_CARE_PROVIDER_SITE_OTHER): Payer: Commercial Managed Care - HMO

## 2022-09-13 ENCOUNTER — Encounter (INDEPENDENT_AMBULATORY_CARE_PROVIDER_SITE_OTHER): Payer: Commercial Managed Care - HMO

## 2022-09-13 ENCOUNTER — Encounter (INDEPENDENT_AMBULATORY_CARE_PROVIDER_SITE_OTHER): Payer: Self-pay

## 2022-09-19 ENCOUNTER — Encounter (INDEPENDENT_AMBULATORY_CARE_PROVIDER_SITE_OTHER): Payer: Self-pay | Admitting: Vascular Surgery

## 2022-09-19 DIAGNOSIS — I83892 Varicose veins of left lower extremities with other complications: Secondary | ICD-10-CM

## 2022-09-20 ENCOUNTER — Ambulatory Visit (INDEPENDENT_AMBULATORY_CARE_PROVIDER_SITE_OTHER): Payer: Commercial Managed Care - HMO

## 2022-09-20 DIAGNOSIS — I83812 Varicose veins of left lower extremities with pain: Secondary | ICD-10-CM

## 2022-09-29 ENCOUNTER — Ambulatory Visit (INDEPENDENT_AMBULATORY_CARE_PROVIDER_SITE_OTHER): Payer: Commercial Managed Care - HMO | Admitting: Vascular Surgery

## 2022-10-20 ENCOUNTER — Encounter (INDEPENDENT_AMBULATORY_CARE_PROVIDER_SITE_OTHER): Payer: Self-pay | Admitting: Vascular Surgery

## 2022-10-20 ENCOUNTER — Ambulatory Visit (INDEPENDENT_AMBULATORY_CARE_PROVIDER_SITE_OTHER): Payer: Commercial Managed Care - HMO | Admitting: Nurse Practitioner

## 2022-10-20 VITALS — BP 151/98 | HR 81 | Resp 16 | Wt >= 6400 oz

## 2022-10-20 DIAGNOSIS — T792XXA Traumatic secondary and recurrent hemorrhage and seroma, initial encounter: Secondary | ICD-10-CM | POA: Diagnosis not present

## 2022-10-20 DIAGNOSIS — M7989 Other specified soft tissue disorders: Secondary | ICD-10-CM | POA: Diagnosis not present

## 2022-10-20 DIAGNOSIS — M79661 Pain in right lower leg: Secondary | ICD-10-CM

## 2022-10-20 MED ORDER — DOXYCYCLINE HYCLATE 100 MG PO CAPS: 100.0000 mg | ORAL_CAPSULE | Freq: Two times a day (BID) | ORAL | 0 refills | Status: DC

## 2022-10-26 ENCOUNTER — Other Ambulatory Visit (INDEPENDENT_AMBULATORY_CARE_PROVIDER_SITE_OTHER): Payer: Self-pay | Admitting: Nurse Practitioner

## 2022-10-26 DIAGNOSIS — M79604 Pain in right leg: Secondary | ICD-10-CM

## 2022-10-27 ENCOUNTER — Encounter (INDEPENDENT_AMBULATORY_CARE_PROVIDER_SITE_OTHER): Payer: Self-pay | Admitting: Nurse Practitioner

## 2022-10-27 ENCOUNTER — Ambulatory Visit (INDEPENDENT_AMBULATORY_CARE_PROVIDER_SITE_OTHER): Payer: Commercial Managed Care - HMO | Admitting: Nurse Practitioner

## 2022-10-27 VITALS — BP 152/89 | HR 74 | Resp 16 | Wt >= 6400 oz

## 2022-10-27 DIAGNOSIS — I83812 Varicose veins of left lower extremities with pain: Secondary | ICD-10-CM | POA: Diagnosis not present

## 2022-10-27 NOTE — Progress Notes (Signed)
History of Present Illness  There is no documented history at this time  Assessments & Plan   There are no diagnoses linked to this encounter.    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    Procedure:  3 layer unna wrap was placed Left lower extremity.   Plan:   Follow up in one week.  

## 2022-10-31 ENCOUNTER — Encounter (INDEPENDENT_AMBULATORY_CARE_PROVIDER_SITE_OTHER): Payer: Self-pay | Admitting: Nurse Practitioner

## 2022-10-31 ENCOUNTER — Ambulatory Visit (INDEPENDENT_AMBULATORY_CARE_PROVIDER_SITE_OTHER): Payer: Commercial Managed Care - HMO

## 2022-10-31 ENCOUNTER — Ambulatory Visit (INDEPENDENT_AMBULATORY_CARE_PROVIDER_SITE_OTHER): Payer: Commercial Managed Care - HMO | Admitting: Nurse Practitioner

## 2022-10-31 VITALS — BP 160/94 | HR 109 | Resp 16 | Wt >= 6400 oz

## 2022-10-31 DIAGNOSIS — T792XXA Traumatic secondary and recurrent hemorrhage and seroma, initial encounter: Secondary | ICD-10-CM | POA: Diagnosis not present

## 2022-10-31 DIAGNOSIS — I83819 Varicose veins of unspecified lower extremities with pain: Secondary | ICD-10-CM | POA: Diagnosis not present

## 2022-10-31 DIAGNOSIS — M79604 Pain in right leg: Secondary | ICD-10-CM

## 2022-10-31 NOTE — Progress Notes (Signed)
Subjective:    Patient ID: Betty Fisher, female    DOB: 10/16/1996, 27 y.o.   MRN: [id number] Chief Complaint  Patient presents with  . Follow-up    Ultrasound follow up    HPI  Review of Systems     Objective:   Physical Exam  BP (!) 160/94 (BP Location: Left Arm)   Pulse (!) 109   Resp 16   Wt (!) 402 lb (182.3 kg)   BMI 62.96 kg/m   Past Medical History:  Diagnosis Date  . Vitamin D deficiency     Social History   Socioeconomic History  . Marital status: Single    Spouse name: Not on file  . Number of children: Not on file  . Years of education: Not on file  . Highest education level: Not on file  Occupational History  . Not on file  Tobacco Use  . Smoking status: Never  . Smokeless tobacco: Never  Vaping Use  . Vaping Use: Never used  Substance and Sexual Activity  . Alcohol use: Yes    Alcohol/week: 1.0 standard drink of alcohol    Types: 1 Glasses of wine per week    Comment: occationally  . Drug use: No  . Sexual activity: Not Currently    Birth control/protection: None  Other Topics Concern  . Not on file  Social History Narrative  . Not on file   Social Determinants of Health   Financial Resource Strain: Not on file  Food Insecurity: Not on file  Transportation Needs: Not on file  Physical Activity: Not on file  Stress: Not on file  Social Connections: Not on file  Intimate Partner Violence: Not on file    Past Surgical History:  Procedure Laterality Date  . INCISION AND DRAINAGE ABSCESS Left 05/25/2021   Procedure: INCISION AND DRAINAGE SEROMA;  Surgeon: Katha Cabal, MD;  Location: [facility] ORS;  Service: Vascular;  Laterality: Left;  . WISDOM TOOTH EXTRACTION      Family History  Problem Relation Age of Onset  . Hypertension Mother   . Hypothyroidism Mother   . Hypertension Father     No Known Allergies     Latest Ref Rng & Units 09/05/2022    9:03 PM 06/21/2022    1:45 PM 05/24/2021    2:52 PM  CBC  WBC 4.0 -  10.5 K/uL 7.7  8.3  7.9   Hemoglobin 12.0 - 15.0 g/dL 14.1  14.9  14.8   Hematocrit 36.0 - 46.0 % 43.3  46.5  44.2   Platelets 150 - 400 K/uL 442  378  388       CMP     Component Value Date/Time   NA 140 09/05/2022 2103   K 3.4 (L) 09/05/2022 2103   CL 106 09/05/2022 2103   CO2 26 09/05/2022 2103   GLUCOSE 113 (H) 09/05/2022 2103   BUN 13 09/05/2022 2103   CREATININE 0.97 09/05/2022 2103   CALCIUM 8.8 (L) 09/05/2022 2103   PROT 7.1 09/05/2022 2103   ALBUMIN 4.0 09/05/2022 2103   AST 22 09/05/2022 2103   ALT 27 09/05/2022 2103   ALKPHOS 84 09/05/2022 2103   BILITOT 0.7 09/05/2022 2103   GFRNONAA >60 09/05/2022 2103   GFRAA >60 07/28/2020 1006     No results found.     Assessment & Plan:   1. Varicose veins with pain ***  2. Posttraumatic seroma (HCC) ***   Current Outpatient Medications on File Prior to  Visit  Medication Sig Dispense Refill  . aspirin 81 MG EC tablet Take 81 mg by mouth daily. Swallow whole.    Marland Kitchen azithromycin (ZITHROMAX) 250 MG tablet Use as directed 6 tablet 0  . doxycycline (VIBRAMYCIN) 100 MG capsule Take 1 capsule (100 mg total) by mouth 2 (two) times daily. 28 capsule 0  . ibuprofen (ADVIL) 800 MG tablet Take 1 tablet (800 mg total) by mouth every 8 (eight) hours as needed. (Patient taking differently: Take 800 mg by mouth every 8 (eight) hours as needed for cramping (menstrual pain relief).) 30 tablet 3  . levothyroxine (SYNTHROID) 75 MCG tablet Take 1 tablet (75 mcg total) by mouth daily. (Patient taking differently: Take 75 mcg by mouth daily before breakfast.) 90 tablet 3  . predniSONE (DELTASONE) 10 MG tablet Take one tab 3 x day for 3 days, then take one tab 2 x a day for 3 days and then take one tab a day for 3 days for copd 18 tablet 0   No current facility-administered medications on file prior to visit.    There are no Patient Instructions on file for this visit. No follow-ups on file.   Kris Hartmann, NP

## 2022-11-01 ENCOUNTER — Encounter (INDEPENDENT_AMBULATORY_CARE_PROVIDER_SITE_OTHER): Payer: Self-pay | Admitting: Nurse Practitioner

## 2022-11-01 NOTE — Progress Notes (Incomplete)
Subjective:    Patient ID: Betty Fisher, female    DOB: Mar 23, 1996, 27 y.o.   MRN: [id number] Chief Complaint  Patient presents with  . Follow-up    4 week post laser follow up    Betty Fisher is a 27 year old female that presents today for follow-up regarding her endovenous laser ablation on her left lower extremity.  She notes that her left lower extremity feels much better postintervention.  However she has began to notice that her right leg is having similar issues.  She also has issues currently with a postoperative seroma area.  The area was drained and subsequently developed infection and required deep incision and drainage.  While the wound is healed reportedly she continues to have issues with swelling and then subsequent reopening of the wound.  There is also some concern due to the drainage for possible recurrence of infection.    Review of Systems  Cardiovascular:  Positive for leg swelling.  Skin:  Positive for wound.  All other systems reviewed and are negative.      Objective:   Physical Exam Vitals reviewed.  HENT:     Head: Normocephalic.  Cardiovascular:     Rate and Rhythm: Normal rate.  Pulmonary:     Effort: Pulmonary effort is normal.  Skin:    General: Skin is warm and dry.  Neurological:     Mental Status: She is alert and oriented to person, place, and time.  Psychiatric:        Mood and Affect: Mood normal.        Behavior: Behavior normal.        Thought Content: Thought content normal.        Judgment: Judgment normal.     BP (!) 151/98 (BP Location: Right Arm)   Pulse 81   Resp 16   Wt (!) 402 lb (182.3 kg)   BMI 62.96 kg/m   Past Medical History:  Diagnosis Date  . Vitamin D deficiency     Social History   Socioeconomic History  . Marital status: Single    Spouse name: Not on file  . Number of children: Not on file  . Years of education: Not on file  . Highest education level: Not on file  Occupational History  . Not on  file  Tobacco Use  . Smoking status: Never  . Smokeless tobacco: Never  Vaping Use  . Vaping Use: Never used  Substance and Sexual Activity  . Alcohol use: Yes    Alcohol/week: 1.0 standard drink of alcohol    Types: 1 Glasses of wine per week    Comment: occationally  . Drug use: No  . Sexual activity: Not Currently    Birth control/protection: None  Other Topics Concern  . Not on file  Social History Narrative  . Not on file   Social Determinants of Health   Financial Resource Strain: Not on file  Food Insecurity: Not on file  Transportation Needs: Not on file  Physical Activity: Not on file  Stress: Not on file  Social Connections: Not on file  Intimate Partner Violence: Not on file    Past Surgical History:  Procedure Laterality Date  . INCISION AND DRAINAGE ABSCESS Left 05/25/2021   Procedure: INCISION AND DRAINAGE SEROMA;  Surgeon: Katha Cabal, MD;  Location: [facility] ORS;  Service: Vascular;  Laterality: Left;  . WISDOM TOOTH EXTRACTION      Family History  Problem Relation Age of Onset  .  Hypertension Mother   . Hypothyroidism Mother   . Hypertension Father     No Known Allergies     Latest Ref Rng & Units 09/05/2022    9:03 PM 06/21/2022    1:45 PM 05/24/2021    2:52 PM  CBC  WBC 4.0 - 10.5 K/uL 7.7  8.3  7.9   Hemoglobin 12.0 - 15.0 g/dL 14.1  14.9  14.8   Hematocrit 36.0 - 46.0 % 43.3  46.5  44.2   Platelets 150 - 400 K/uL 442  378  388       CMP     Component Value Date/Time   NA 140 09/05/2022 2103   K 3.4 (L) 09/05/2022 2103   CL 106 09/05/2022 2103   CO2 26 09/05/2022 2103   GLUCOSE 113 (H) 09/05/2022 2103   BUN 13 09/05/2022 2103   CREATININE 0.97 09/05/2022 2103   CALCIUM 8.8 (L) 09/05/2022 2103   PROT 7.1 09/05/2022 2103   ALBUMIN 4.0 09/05/2022 2103   AST 22 09/05/2022 2103   ALT 27 09/05/2022 2103   ALKPHOS 84 09/05/2022 2103   BILITOT 0.7 09/05/2022 2103   GFRNONAA >60 09/05/2022 2103   GFRAA >60 07/28/2020 1006      No results found.     Assessment & Plan:   1. Pain and swelling of right lower leg ***  2. Posttraumatic seroma (HCC) ***   Current Outpatient Medications on File Prior to Visit  Medication Sig Dispense Refill  . aspirin 81 MG EC tablet Take 81 mg by mouth daily. Swallow whole.    Marland Kitchen azithromycin (ZITHROMAX) 250 MG tablet Use as directed 6 tablet 0  . ibuprofen (ADVIL) 800 MG tablet Take 1 tablet (800 mg total) by mouth every 8 (eight) hours as needed. (Patient taking differently: Take 800 mg by mouth every 8 (eight) hours as needed for cramping (menstrual pain relief).) 30 tablet 3  . levothyroxine (SYNTHROID) 75 MCG tablet Take 1 tablet (75 mcg total) by mouth daily. (Patient taking differently: Take 75 mcg by mouth daily before breakfast.) 90 tablet 3  . predniSONE (DELTASONE) 10 MG tablet Take one tab 3 x day for 3 days, then take one tab 2 x a day for 3 days and then take one tab a day for 3 days for copd 18 tablet 0   No current facility-administered medications on file prior to visit.    There are no Patient Instructions on file for this visit. No follow-ups on file.   Kris Hartmann, NP

## 2022-11-01 NOTE — Progress Notes (Signed)
Subjective:    Patient ID: Betty Fisher, female    DOB: [birth year], 27 y.o.   MRN: [id number] Chief Complaint  Patient presents with   Follow-up    4 week post laser follow up    Betty Fisher is a 27 year old female that presents today for follow-up regarding her endovenous laser ablation on her left lower extremity.  She notes that her left lower extremity feels much better postintervention.  However she has began to notice that her right leg is having similar issues.  She also has issues currently with a postoperative seroma area.  The area was drained and subsequently developed infection and required deep incision and drainage.  While the wound is healed reportedly she continues to have issues with swelling and then subsequent reopening of the wound.  There is also some concern due to the drainage for possible recurrence of infection.    Review of Systems  Cardiovascular:  Positive for leg swelling.  Skin:  Positive for wound.  All other systems reviewed and are negative.      Objective:   Physical Exam Vitals reviewed.  HENT:     Head: Normocephalic.  Cardiovascular:     Rate and Rhythm: Normal rate.  Pulmonary:     Effort: Pulmonary effort is normal.  Skin:    General: Skin is warm and dry.  Neurological:     Mental Status: She is alert and oriented to person, place, and time.  Psychiatric:        Mood and Affect: Mood normal.        Behavior: Behavior normal.        Thought Content: Thought content normal.        Judgment: Judgment normal.     BP (!) 151/98 (BP Location: Right Arm)   Pulse 81   Resp 16   Wt (!) 402 lb (182.3 kg)   BMI 62.96 kg/m   Past Medical History:  Diagnosis Date   Vitamin D deficiency     Social History   Socioeconomic History   Marital status: Single    Spouse name: Not on file   Number of children: Not on file   Years of education: Not on file   Highest education level: Not on file  Occupational History   Not on file   Tobacco Use   Smoking status: Never   Smokeless tobacco: Never  Vaping Use   Vaping Use: Never used  Substance and Sexual Activity   Alcohol use: Yes    Alcohol/week: 1.0 standard drink of alcohol    Types: 1 Glasses of wine per week    Comment: occationally   Drug use: No   Sexual activity: Not Currently    Birth control/protection: None  Other Topics Concern   Not on file  Social History Narrative   Not on file   Social Determinants of Health   Financial Resource Strain: Not on file  Food Insecurity: Not on file  Transportation Needs: Not on file  Physical Activity: Not on file  Stress: Not on file  Social Connections: Not on file  Intimate Partner Violence: Not on file    Past Surgical History:  Procedure Laterality Date   INCISION AND DRAINAGE ABSCESS Left 05/25/2021   Procedure: INCISION AND DRAINAGE SEROMA;  Surgeon: Katha Cabal, MD;  Location: [facility] ORS;  Service: Vascular;  Laterality: Left;   WISDOM TOOTH EXTRACTION      Family History  Problem Relation Age of Onset  Hypertension Mother    Hypothyroidism Mother    Hypertension Father     No Known Allergies     Latest Ref Rng & Units 09/05/2022    9:03 PM 06/21/2022    1:45 PM 05/24/2021    2:52 PM  CBC  WBC 4.0 - 10.5 K/uL 7.7  8.3  7.9   Hemoglobin 12.0 - 15.0 g/dL 14.1  14.9  14.8   Hematocrit 36.0 - 46.0 % 43.3  46.5  44.2   Platelets 150 - 400 K/uL 442  378  388       CMP     Component Value Date/Time   NA 140 09/05/2022 2103   K 3.4 (L) 09/05/2022 2103   CL 106 09/05/2022 2103   CO2 26 09/05/2022 2103   GLUCOSE 113 (H) 09/05/2022 2103   BUN 13 09/05/2022 2103   CREATININE 0.97 09/05/2022 2103   CALCIUM 8.8 (L) 09/05/2022 2103   PROT 7.1 09/05/2022 2103   ALBUMIN 4.0 09/05/2022 2103   AST 22 09/05/2022 2103   ALT 27 09/05/2022 2103   ALKPHOS 84 09/05/2022 2103   BILITOT 0.7 09/05/2022 2103   GFRNONAA >60 09/05/2022 2103   GFRAA >60 07/28/2020 1006     No results  found.     Assessment & Plan:   1. Pain and swelling of right lower leg Will have the patient return at her convenience for a venous reflux study of the right lower extremity as I suspect that she may also have reflux in her right lower extremity.  The patient is advised to continue with conservative therapy including compression socks elevation and use of NSAIDs.  2. Posttraumatic seroma ([place]) I suspect that the reason for the continued issues is the swelling that the patient is experiencing in the left lower extremity.  Although is improved post laser I suspect that several weeks ago the wraps may help the patient finally heal and stop the recurrence of wounds.  Will also send an antibiotic for the patient.   Current Outpatient Medications on File Prior to Visit  Medication Sig Dispense Refill   aspirin 81 MG EC tablet Take 81 mg by mouth daily. Swallow whole.     azithromycin (ZITHROMAX) 250 MG tablet Use as directed 6 tablet 0   ibuprofen (ADVIL) 800 MG tablet Take 1 tablet (800 mg total) by mouth every 8 (eight) hours as needed. (Patient taking differently: Take 800 mg by mouth every 8 (eight) hours as needed for cramping (menstrual pain relief).) 30 tablet 3   levothyroxine (SYNTHROID) 75 MCG tablet Take 1 tablet (75 mcg total) by mouth daily. (Patient taking differently: Take 75 mcg by mouth daily before breakfast.) 90 tablet 3   predniSONE (DELTASONE) 10 MG tablet Take one tab 3 x day for 3 days, then take one tab 2 x a day for 3 days and then take one tab a day for 3 days for copd 18 tablet 0   No current facility-administered medications on file prior to visit.    There are no Patient Instructions on file for this visit. No follow-ups on file.   Kris Hartmann, NP

## 2022-11-07 ENCOUNTER — Encounter (INDEPENDENT_AMBULATORY_CARE_PROVIDER_SITE_OTHER): Payer: Commercial Managed Care - HMO

## 2022-11-08 ENCOUNTER — Encounter (INDEPENDENT_AMBULATORY_CARE_PROVIDER_SITE_OTHER): Payer: Commercial Managed Care - HMO

## 2022-11-13 ENCOUNTER — Encounter (INDEPENDENT_AMBULATORY_CARE_PROVIDER_SITE_OTHER): Payer: Commercial Managed Care - HMO

## 2022-11-14 ENCOUNTER — Encounter (INDEPENDENT_AMBULATORY_CARE_PROVIDER_SITE_OTHER): Payer: Commercial Managed Care - HMO

## 2022-11-21 ENCOUNTER — Encounter (INDEPENDENT_AMBULATORY_CARE_PROVIDER_SITE_OTHER): Payer: Commercial Managed Care - HMO

## 2022-11-28 ENCOUNTER — Ambulatory Visit (INDEPENDENT_AMBULATORY_CARE_PROVIDER_SITE_OTHER): Payer: Commercial Managed Care - HMO | Admitting: Nurse Practitioner

## 2022-12-05 ENCOUNTER — Telehealth (INDEPENDENT_AMBULATORY_CARE_PROVIDER_SITE_OTHER): Payer: BC Managed Care – PPO | Admitting: Nurse Practitioner

## 2022-12-05 ENCOUNTER — Encounter: Payer: Self-pay | Admitting: Nurse Practitioner

## 2022-12-05 VITALS — Resp 16 | Ht 67.0 in | Wt 375.0 lb

## 2022-12-05 DIAGNOSIS — J208 Acute bronchitis due to other specified organisms: Secondary | ICD-10-CM | POA: Diagnosis not present

## 2022-12-05 DIAGNOSIS — B9689 Other specified bacterial agents as the cause of diseases classified elsewhere: Secondary | ICD-10-CM

## 2022-12-05 MED ORDER — AZITHROMYCIN 250 MG PO TABS: ORAL_TABLET | ORAL | 0 refills | Status: AC

## 2022-12-05 MED ORDER — PREDNISONE 10 MG PO TABS: ORAL_TABLET | ORAL | 0 refills | Status: DC

## 2022-12-05 NOTE — Progress Notes (Signed)
[facility] [place], [place] [address]  Internal MEDICINE  Telephone Visit  Patient Name: Betty Fisher  [id number]  [id number]  Date of Service: 12/05/2022  I connected with the patient at 1640 by telephone and verified the patients identity using two identifiers.   I discussed the limitations, risks, security and privacy concerns of performing an evaluation and management service by telephone and the availability of in person appointments. I also discussed with the patient that there may be a patient responsible charge related to the service.  The patient expressed understanding and agrees to proceed.    Chief Complaint  Patient presents with   Telephone Screen    Cough, covid test negative [phone]   Telephone Assessment   Cough   Headache    HPI Elasha presents for a telehealth virtual visit for cough and headache Negative for covid Started 6 days ago.  Has been getting bronchitis in the fall/winter time.  Had prednisone and zpak in October and her bronchitis resolved.      Current Medication: Outpatient Encounter Medications as of 12/05/2022  Medication Sig   aspirin 81 MG EC tablet Take 81 mg by mouth daily. Swallow whole.   azithromycin (ZITHROMAX) 250 MG tablet Take 2 tablets on day 1, then 1 tablet daily on days 2 through 5   doxycycline (VIBRAMYCIN) 100 MG capsule Take 1 capsule (100 mg total) by mouth 2 (two) times daily.   ibuprofen (ADVIL) 800 MG tablet Take 1 tablet (800 mg total) by mouth every 8 (eight) hours as needed. (Patient taking differently: Take 800 mg by mouth every 8 (eight) hours as needed for cramping (menstrual pain relief).)   levothyroxine (SYNTHROID) 75 MCG tablet Take 1 tablet (75 mcg total) by mouth daily. (Patient taking differently: Take 75 mcg by mouth daily before breakfast.)   [DISCONTINUED] azithromycin (ZITHROMAX) 250 MG tablet Use as directed   [DISCONTINUED] predniSONE (DELTASONE) 10 MG tablet Take one tab  3 x day for 3 days, then take one tab 2 x a day for 3 days and then take one tab a day for 3 days for copd   predniSONE (DELTASONE) 10 MG tablet Take one tab 3 x day for 3 days, then take one tab 2 x a day for 3 days and then take one tab a day for 3 days for copd   No facility-administered encounter medications on file as of 12/05/2022.    Surgical History: Past Surgical History:  Procedure Laterality Date   INCISION AND DRAINAGE ABSCESS Left 05/25/2021   Procedure: INCISION AND DRAINAGE SEROMA;  Surgeon: Katha Cabal, MD;  Location: [facility] ORS;  Service: Vascular;  Laterality: Left;   WISDOM TOOTH EXTRACTION      Medical History: Past Medical History:  Diagnosis Date   Vitamin D deficiency     Family History: Family History  Problem Relation Age of Onset   Hypertension Mother    Hypothyroidism Mother    Hypertension Father     Social History   Socioeconomic History   Marital status: Single    Spouse name: Not on file   Number of children: Not on file   Years of education: Not on file   Highest education level: Not on file  Occupational History   Not on file  Tobacco Use   Smoking status: Never   Smokeless tobacco: Never  Vaping Use   Vaping Use: Never used  Substance and Sexual Activity   Alcohol use: Yes  Alcohol/week: 1.0 standard drink of alcohol    Types: 1 Glasses of wine per week    Comment: occationally   Drug use: No   Sexual activity: Not Currently    Birth control/protection: None  Other Topics Concern   Not on file  Social History Narrative   Not on file   Social Determinants of Health   Financial Resource Strain: Not on file  Food Insecurity: Not on file  Transportation Needs: Not on file  Physical Activity: Not on file  Stress: Not on file  Social Connections: Not on file  Intimate Partner Violence: Not on file      Review of Systems  Constitutional:  Positive for fatigue. Negative for chills and fever.  HENT:  Positive for  congestion and postnasal drip. Negative for ear pain, rhinorrhea, sinus pressure, sinus pain, sneezing, sore throat and trouble swallowing.   Respiratory:  Positive for cough. Negative for chest tightness, shortness of breath and wheezing.   Cardiovascular: Negative.  Negative for chest pain and palpitations.  Neurological:  Positive for headaches.    Vital Signs: Resp 16   Ht '5\' 7"'$  (1.702 m)   Wt (!) 375 lb (170.1 kg)   BMI 58.73 kg/m    Observation/Objective: She is alert and oriented and engages in conversation appropriately. No acute distress noted.    Assessment/Plan: 1. Acute bacterial bronchitis Antibiotic and prednisone taper prescribed.  - predniSONE (DELTASONE) 10 MG tablet; Take one tab 3 x day for 3 days, then take one tab 2 x a day for 3 days and then take one tab a day for 3 days for copd  Dispense: 18 tablet; Refill: 0 - azithromycin (ZITHROMAX) 250 MG tablet; Take 2 tablets on day 1, then 1 tablet daily on days 2 through 5  Dispense: 6 tablet; Refill: 0   General Counseling: Analysse verbalizes understanding of the findings of today's phone visit and agrees with plan of treatment. I have discussed any further diagnostic evaluation that may be needed or ordered today. We also reviewed her medications today. she has been encouraged to call the office with any questions or concerns that should arise related to todays visit.  Return if symptoms worsen or fail to improve.   No orders of the defined types were placed in this encounter.   Meds ordered this encounter  Medications   predniSONE (DELTASONE) 10 MG tablet    Sig: Take one tab 3 x day for 3 days, then take one tab 2 x a day for 3 days and then take one tab a day for 3 days for copd    Dispense:  18 tablet    Refill:  0   azithromycin (ZITHROMAX) 250 MG tablet    Sig: Take 2 tablets on day 1, then 1 tablet daily on days 2 through 5    Dispense:  6 tablet    Refill:  0    Time spent:30 Minutes Time spent  with patient included reviewing progress notes, labs, imaging studies, and discussing plan for follow up.  [place] Controlled Substance Database was reviewed by me for overdose risk score (ORS) if appropriate.  This patient was seen by Jonetta Osgood, FNP-C in collaboration with Dr. [name] as a part of collaborative care agreement.  Fareeha Evon R. Valetta Fuller, MSN, FNP-C Internal medicine

## 2022-12-26 ENCOUNTER — Other Ambulatory Visit: Payer: Self-pay

## 2022-12-26 MED ORDER — LEVOTHYROXINE SODIUM 75 MCG PO TABS: 75.0000 ug | ORAL_TABLET | Freq: Every day | ORAL | 0 refills | Status: AC

## 2022-12-27 ENCOUNTER — Encounter: Payer: Self-pay | Admitting: Nurse Practitioner

## 2022-12-27 ENCOUNTER — Telehealth (INDEPENDENT_AMBULATORY_CARE_PROVIDER_SITE_OTHER): Payer: BC Managed Care – PPO | Admitting: Nurse Practitioner

## 2022-12-27 VITALS — Ht 67.0 in | Wt 368.0 lb

## 2022-12-27 DIAGNOSIS — J0110 Acute frontal sinusitis, unspecified: Secondary | ICD-10-CM

## 2022-12-27 DIAGNOSIS — R1114 Bilious vomiting: Secondary | ICD-10-CM | POA: Diagnosis not present

## 2022-12-27 MED ORDER — ONDANSETRON 4 MG PO TBDP: 4.0000 mg | ORAL_TABLET | Freq: Three times a day (TID) | ORAL | 0 refills | Status: DC | PRN

## 2022-12-27 MED ORDER — AMOXICILLIN-POT CLAVULANATE 875-125 MG PO TABS: 1.0000 | ORAL_TABLET | Freq: Two times a day (BID) | ORAL | 0 refills | Status: DC

## 2022-12-27 NOTE — Progress Notes (Signed)
[facility] [place], [place] [address]  Internal MEDICINE  Telephone Visit  Patient Name: Betty Fisher  [id number]  [id number]  Date of Service: 12/27/2022  I connected with the patient at 1550 by telephone and verified the patients identity using two identifiers.   I discussed the limitations, risks, security and privacy concerns of performing an evaluation and management service by telephone and the availability of in person appointments. I also discussed with the patient that there may be a patient responsible charge related to the service.  The patient expressed understanding and agrees to proceed.    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  Nasal congestion, cough, nausea, vomiting, feeling hot but no fever, chills and body aches, back pain, diarrhea, ear pain bilaterally  Symptoms started last Thursday. Multiple negative covid tests.    Current Medication: Outpatient Encounter Medications as of 12/27/2022  Medication Sig   amoxicillin-clavulanate (AUGMENTIN) 875-125 MG tablet Take 1 tablet by mouth 2 (two) times daily. Take with food.   aspirin 81 MG EC tablet Take 81 mg by mouth daily. Swallow whole.   doxycycline (VIBRAMYCIN) 100 MG capsule Take 1 capsule (100 mg total) by mouth 2 (two) times daily.   ibuprofen (ADVIL) 800 MG tablet Take 1 tablet (800 mg total) by mouth every 8 (eight) hours as needed. (Patient taking differently: Take 800 mg by mouth every 8 (eight) hours as needed for cramping (menstrual pain relief).)   levothyroxine (SYNTHROID) 75 MCG tablet Take 1 tablet (75 mcg total) by mouth daily before breakfast.   ondansetron (ZOFRAN-ODT) 4 MG disintegrating tablet Take 1  tablet (4 mg total) by mouth every 8 (eight) hours as needed for nausea or vomiting.   predniSONE (DELTASONE) 10 MG tablet Take one tab 3 x day for 3 days, then take one tab 2 x a day for 3 days and then take one tab a day for 3 days for copd   No facility-administered encounter medications on file as of 12/27/2022.    Surgical History: Past Surgical History:  Procedure Laterality Date   INCISION AND DRAINAGE ABSCESS Left 05/25/2021   Procedure: INCISION AND DRAINAGE SEROMA;  Surgeon: Katha Cabal, MD;  Location: [facility] ORS;  Service: Vascular;  Laterality: Left;   WISDOM TOOTH EXTRACTION      Medical History: Past Medical History:  Diagnosis Date   Vitamin D deficiency     Family History: Family History  Problem Relation Age of Onset   Hypertension Mother    Hypothyroidism Mother    Hypertension Father     Social History   Socioeconomic History   Marital status: Single    Spouse name: Not on file   Number of children: Not on file   Years of education: Not on file   Highest education level: Not on file  Occupational History   Not on file  Tobacco Use   Smoking status: Never   Smokeless tobacco: Never  Vaping Use   Vaping Use: Never used  Substance and Sexual Activity   Alcohol use: Yes    Alcohol/week: 1.0 standard drink of alcohol    Types: 1 Glasses of wine per  week    Comment: occationally   Drug use: No   Sexual activity: Not Currently    Birth control/protection: None  Other Topics Concern   Not on file  Social History Narrative   Not on file   Social Determinants of Health   Financial Resource Strain: Not on file  Food Insecurity: Not on file  Transportation Needs: Not on file  Physical Activity: Not on file  Stress: Not on file  Social Connections: Not on file  Intimate Partner Violence: Not on file      Review of Systems  Constitutional:  Positive for chills, fatigue and fever.  HENT:  Positive for congestion, ear pain, postnasal drip,  rhinorrhea, sinus pressure, sinus pain, sneezing and sore throat.   Respiratory:  Positive for cough. Negative for chest tightness, shortness of breath and wheezing.   Cardiovascular:  Negative for chest pain and palpitations.  Gastrointestinal:  Positive for diarrhea, nausea and vomiting.  Musculoskeletal:  Positive for myalgias.  Neurological:  Positive for headaches.    Vital Signs: Ht '5\' 7"'$  (1.702 m)   Wt (!) 368 lb (166.9 kg)   BMI 57.64 kg/m    Observation/Objective: She is alert and oriented and engages in conversation appropriately. No acute distress noted.     Assessment/Plan: 1. Acute non-recurrent frontal sinusitis Empiric antibiotic prescribed.  - amoxicillin-clavulanate (AUGMENTIN) 875-125 MG tablet; Take 1 tablet by mouth 2 (two) times daily. Take with food.  Dispense: 20 tablet; Refill: 0  2. Bilious vomiting with nausea Zofran prescribed for nausea and vomiting - ondansetron (ZOFRAN-ODT) 4 MG disintegrating tablet; Take 1 tablet (4 mg total) by mouth every 8 (eight) hours as needed for nausea or vomiting.  Dispense: 20 tablet; Refill: 0   General Counseling: Betty Fisher verbalizes understanding of the findings of today's phone visit and agrees with plan of treatment. I have discussed any further diagnostic evaluation that may be needed or ordered today. We also reviewed her medications today. she has been encouraged to call the office with any questions or concerns that should arise related to todays visit.  Return if symptoms worsen or fail to improve.   No orders of the defined types were placed in this encounter.   Meds ordered this encounter  Medications   ondansetron (ZOFRAN-ODT) 4 MG disintegrating tablet    Sig: Take 1 tablet (4 mg total) by mouth every 8 (eight) hours as needed for nausea or vomiting.    Dispense:  20 tablet    Refill:  0   amoxicillin-clavulanate (AUGMENTIN) 875-125 MG tablet    Sig: Take 1 tablet by mouth 2 (two) times daily. Take  with food.    Dispense:  20 tablet    Refill:  0    Time spent:10 Minutes Time spent with patient included reviewing progress notes, labs, imaging studies, and discussing plan for follow up.  [place] Controlled Substance Database was reviewed by me for overdose risk score (ORS) if appropriate.  This patient was seen by Jonetta Osgood, FNP-C in collaboration with Dr. [name] as a part of collaborative care agreement.  Avant Printy R. Valetta Fuller, MSN, FNP-C Internal medicine

## 2023-01-02 ENCOUNTER — Other Ambulatory Visit: Payer: Self-pay | Admitting: Nurse Practitioner

## 2023-01-02 MED ORDER — FLUCONAZOLE 150 MG PO TABS: 150.0000 mg | ORAL_TABLET | Freq: Once | ORAL | 0 refills | Status: AC

## 2023-01-15 ENCOUNTER — Telehealth (INDEPENDENT_AMBULATORY_CARE_PROVIDER_SITE_OTHER): Payer: Self-pay | Admitting: Nurse Practitioner

## 2023-01-15 ENCOUNTER — Telehealth: Payer: Self-pay | Admitting: Nurse Practitioner

## 2023-01-15 ENCOUNTER — Telehealth: Payer: Self-pay | Admitting: Internal Medicine

## 2023-01-15 NOTE — Telephone Encounter (Signed)
Error

## 2023-01-15 NOTE — Telephone Encounter (Signed)
Betty Fisher called and stated her legs are giving her a fit and she is leaving on Friday for [place] on Spring Break and wanted to know if she could come in before Friday to have them looked at.  Please advise.

## 2023-01-15 NOTE — Telephone Encounter (Signed)
I had to contact the patient to determine what "giving her a fit" meant, but let's bring her in for a DVT study if there is available time.

## 2023-01-16 ENCOUNTER — Telehealth (INDEPENDENT_AMBULATORY_CARE_PROVIDER_SITE_OTHER): Payer: Self-pay | Admitting: Nurse Practitioner

## 2023-01-18 ENCOUNTER — Ambulatory Visit (INDEPENDENT_AMBULATORY_CARE_PROVIDER_SITE_OTHER): Payer: BC Managed Care – PPO | Admitting: Physician Assistant

## 2023-01-18 ENCOUNTER — Encounter: Payer: Self-pay | Admitting: Physician Assistant

## 2023-01-18 VITALS — HR 99 | Temp 98.4°F | Resp 16 | Ht 67.0 in | Wt >= 6400 oz

## 2023-01-18 DIAGNOSIS — M7989 Other specified soft tissue disorders: Secondary | ICD-10-CM

## 2023-01-18 DIAGNOSIS — I83812 Varicose veins of left lower extremities with pain: Secondary | ICD-10-CM | POA: Diagnosis not present

## 2023-01-18 DIAGNOSIS — M79662 Pain in left lower leg: Secondary | ICD-10-CM

## 2023-01-18 DIAGNOSIS — T792XXA Traumatic secondary and recurrent hemorrhage and seroma, initial encounter: Secondary | ICD-10-CM

## 2023-01-18 NOTE — Progress Notes (Signed)
[facility] [place], [place] [address]  Internal MEDICINE  Office Visit Note  Patient Name: Betty Fisher  [id number]  [id number]  Date of Service: 01/19/2023  Chief Complaint  Patient presents with   Acute Visit   Leg Pain    Left shin pain, ongoing since fall 2 years ago. Seen at [facility] for this issue, DFK ordered U/S for possible hematoma. Seen at vein and vascular and was given a drain, had stitches removed and noticed swelling and began running a fever of 104. Blood work indicated high WBC, hospitalized for 3 weeks. Has had a horrible time keeping communication with vein and vascular here in [place] and would like to referred to a new facility. Having constant drainage from shin wound, wound has not closed up.   Medication Refill    800mg  Ibuprofen     HPI Pt is here for a sick visit. -Ongoing problem with her left shin. [place] two years and went to [facility] to confirm no blood clot. She then saw GS for possible hematoma, however this was not the case. -She ended up having surgery and had a drain put in, instead of woundvac. She developed cellulitis and was admitted for almost 3 weeks. Ended up getting wound vac then. Was doing wet to dry then after that and did well but stagnated while not comepletely healed so went to wound care for this. They did wraps -Fluctuates some times with swelling. Denies fever or chills. -she has been seeing vascular and has had unna boots and vein ablations done, but states she continues to have problems with her left leg and it stays swollen and tight. She states it feels like a brick with how firm the skin is around the partially healed wound. She is interested in a referral for second opinion -Currently the leg is very swollen which she states is normal for her and it does not feel warm and only draining a little clear fluid, but no purulent discharge. Not tender. No redness. She has had cellulitis before and will keep a close eye  on this and go to ED for any new or worsening changes. We also discussed risk of blood clots, however this has been a chronic condition for her and has previously been evaluated for this and was again advised to go to ED for any new changes.  Current Medication:  Outpatient Encounter Medications as of 01/18/2023  Medication Sig   amoxicillin-clavulanate (AUGMENTIN) 875-125 MG tablet Take 1 tablet by mouth 2 (two) times daily. Take with food.   aspirin 81 MG EC tablet Take 81 mg by mouth daily. Swallow whole.   ibuprofen (ADVIL) 800 MG tablet Take 1 tablet (800 mg total) by mouth every 8 (eight) hours as needed. (Patient taking differently: Take 800 mg by mouth every 8 (eight) hours as needed for cramping (menstrual pain relief).)   levothyroxine (SYNTHROID) 75 MCG tablet Take 1 tablet (75 mcg total) by mouth daily before breakfast.   ondansetron (ZOFRAN-ODT) 4 MG disintegrating tablet Take 1 tablet (4 mg total) by mouth every 8 (eight) hours as needed for nausea or vomiting.   [DISCONTINUED] doxycycline (VIBRAMYCIN) 100 MG capsule Take 1 capsule (100 mg total) by mouth 2 (two) times daily.   [DISCONTINUED] predniSONE (DELTASONE) 10 MG tablet Take one tab 3 x day for 3 days, then take one tab 2 x a day for 3 days and then take one tab a day for 3 days for copd   No  facility-administered encounter medications on file as of 01/18/2023.      Medical History: Past Medical History:  Diagnosis Date   Vitamin D deficiency      Vital Signs: Pulse 99   Temp 98.4 F (36.9 C)   Resp 16   Ht 5\' 7"  (1.702 m)   Wt (!) 417 lb 6.4 oz (189.3 kg)   SpO2 98%   BMI 65.37 kg/m    Review of Systems  Constitutional:  Negative for fatigue and fever.  HENT:  Negative for congestion, mouth sores and postnasal drip.   Respiratory:  Negative for cough.   Cardiovascular:  Positive for leg swelling. Negative for chest pain.  Genitourinary:  Negative for flank pain.  Skin:  Positive for wound.   Psychiatric/Behavioral: Negative.      Physical Exam Vitals and nursing note reviewed.  Constitutional:      General: She is not in acute distress.    Appearance: She is well-developed. She is obese. She is not diaphoretic.  HENT:     Head: Normocephalic and atraumatic.     Mouth/Throat:     Pharynx: No oropharyngeal exudate.  Eyes:     Pupils: Pupils are equal, round, and reactive to light.  Neck:     Thyroid: No thyromegaly.     Vascular: No JVD.     Trachea: No tracheal deviation.  Cardiovascular:     Rate and Rhythm: Normal rate and regular rhythm.     Heart sounds: Normal heart sounds. No murmur heard.    No friction rub. No gallop.  Pulmonary:     Effort: Pulmonary effort is normal. No respiratory distress.     Breath sounds: No wheezing or rales.  Chest:     Chest wall: No tenderness.  Abdominal:     General: Bowel sounds are normal.     Palpations: Abdomen is soft.  Musculoskeletal:        General: Normal range of motion.     Cervical back: Normal range of motion and neck supple.     Left lower leg: Edema present.     Comments: Significant swelling of LLE with open wound along shin, without increased warmth, erythema or purulent drainage on exam. Nontender  Lymphadenopathy:     Cervical: No cervical adenopathy.  Skin:    General: Skin is warm and dry.     Findings: Lesion present. No erythema.  Neurological:     Mental Status: She is alert and oriented to person, place, and time.     Cranial Nerves: No cranial nerve deficit.  Psychiatric:        Behavior: Behavior normal.        Thought Content: Thought content normal.        Judgment: Judgment normal.       Assessment/Plan: 1. 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 Advised to go to ED if any new or worsening changes. - Ambulatory referral to Vascular Surgery  2. Varicose veins of leg with pain, left - Ambulatory referral to Vascular Surgery  3. Pain and swelling of  left lower leg Pt requests referral for second opinion. Advised to go to ED if any new or worsening changes. - Ambulatory referral to Vascular Surgery   General Counseling: kassi sweetland understanding of the findings of todays visit and agrees with plan of treatment. I have discussed any further diagnostic evaluation that may be needed or ordered today. We also reviewed her medications today. she has been encouraged to  call the office with any questions or concerns that should arise related to todays visit.    Counseling:    Orders Placed This Encounter  Procedures   Ambulatory referral to Vascular Surgery    No orders of the defined types were placed in this encounter.   Time spent:30 Minutes

## 2023-01-19 ENCOUNTER — Telehealth: Payer: Self-pay | Admitting: Physician Assistant

## 2023-01-19 NOTE — Telephone Encounter (Signed)
Awaiting 01/18/23 office notes for vascular surgery referral-Toni

## 2023-01-23 ENCOUNTER — Telehealth: Payer: Self-pay | Admitting: Physician Assistant

## 2023-01-23 NOTE — Telephone Encounter (Signed)
Vascular Surgery referral faxed to [facility] in [place] ; [phone]

## 2023-01-25 ENCOUNTER — Telehealth: Payer: Self-pay | Admitting: Physician Assistant

## 2023-01-25 NOTE — Telephone Encounter (Signed)
Vascular referral faxed to [facility] [place] ; [phone]

## 2023-02-28 NOTE — Telephone Encounter (Signed)
Error

## 2023-03-06 ENCOUNTER — Other Ambulatory Visit (INDEPENDENT_AMBULATORY_CARE_PROVIDER_SITE_OTHER): Payer: Self-pay | Admitting: Nurse Practitioner

## 2023-03-06 ENCOUNTER — Telehealth: Payer: Self-pay

## 2023-03-06 ENCOUNTER — Telehealth (INDEPENDENT_AMBULATORY_CARE_PROVIDER_SITE_OTHER): Payer: Self-pay

## 2023-03-06 MED ORDER — DOXYCYCLINE HYCLATE 100 MG PO CAPS: 100.0000 mg | ORAL_CAPSULE | Freq: Two times a day (BID) | ORAL | 0 refills | Status: DC

## 2023-03-06 NOTE — Telephone Encounter (Signed)
I can send her 1 round of antibiotic if it does not resolve or worsen she will need to be seen

## 2023-03-06 NOTE — Telephone Encounter (Signed)
Pt made aware and states verbal understanding

## 2023-03-07 ENCOUNTER — Telehealth (INDEPENDENT_AMBULATORY_CARE_PROVIDER_SITE_OTHER): Payer: Self-pay | Admitting: Nurse Practitioner

## 2023-03-07 NOTE — Telephone Encounter (Signed)
Patient called in stating that she wants to talk with nurse or Sheppard Plumber NP. Thinks the antibiotic she is taking may not be helping her. Was told by her "nurse friend" at work that she may have cellulitis. She is due to see her PCP tomorrow but wants to know if she needs to stop taking the antibiotics immediatly because she is 99% sure she has cellulitis and the antibiotics doesn't help with this.     Please call and advise

## 2023-03-07 NOTE — Telephone Encounter (Signed)
So I called discussed with patient that the antibiotic I sent her was purposely sent in to treat the cellulitis.  Based on her history I suspect that she had cellulitis and that was the intention to treat.  The patient has only had the antibiotics for 1 day and so it certainly not possible that she has been able to see any sort of improvement.  I advised the patient to continue taking them and to contact us in a week if it is not improved.

## 2023-03-08 NOTE — Telephone Encounter (Signed)
Done

## 2023-04-20 ENCOUNTER — Encounter (INDEPENDENT_AMBULATORY_CARE_PROVIDER_SITE_OTHER): Payer: Self-pay

## 2023-05-09 ENCOUNTER — Encounter (INDEPENDENT_AMBULATORY_CARE_PROVIDER_SITE_OTHER): Payer: Self-pay

## 2023-08-15 ENCOUNTER — Ambulatory Visit: Payer: BC Managed Care – PPO | Admitting: Nurse Practitioner

## 2023-08-15 ENCOUNTER — Encounter: Payer: Self-pay | Admitting: Nurse Practitioner

## 2023-08-15 VITALS — BP 160/106 | HR 85 | Temp 98.4°F | Resp 16 | Ht 67.0 in | Wt >= 6400 oz

## 2023-08-15 DIAGNOSIS — L2989 Other pruritus: Secondary | ICD-10-CM | POA: Diagnosis not present

## 2023-08-15 DIAGNOSIS — I890 Lymphedema, not elsewhere classified: Secondary | ICD-10-CM

## 2023-08-15 DIAGNOSIS — J302 Other seasonal allergic rhinitis: Secondary | ICD-10-CM | POA: Diagnosis not present

## 2023-08-15 MED ORDER — TRIAMCINOLONE ACETONIDE 0.1 % EX CREA: 1.0000 | TOPICAL_CREAM | Freq: Two times a day (BID) | CUTANEOUS | 2 refills | Status: DC

## 2023-08-15 NOTE — Progress Notes (Signed)
[facility] [address] [place], [place] [address]  Internal MEDICINE  Office Visit Note  Patient Name: Betty Fisher  [id number]  [id number]  Date of Service: 08/15/2023  Chief Complaint  Patient presents with   Acute Visit    Hives on both arms and cough     HPI Betty Fisher presents for an acute sick visit for hives and cough. Concerned about allergies Dry cough -- possible environmental allergies and or food allergies.  Keeps getting rash on forearms, not getting better, keeps coming back. No change in soap, detergent or otherwise.   Due to severe lymphedema of left leg, needs custom made compression stockings.     Current Medication:  Outpatient Encounter Medications as of 08/15/2023  Medication Sig   doxycycline (VIBRAMYCIN) 100 MG capsule Take 1 capsule (100 mg total) by mouth 2 (two) times daily.   triamcinolone cream (KENALOG) 0.1 % Apply 1 Application topically 2 (two) times daily.   aspirin 81 MG EC tablet Take 81 mg by mouth daily. Swallow whole.   ibuprofen (ADVIL) 800 MG tablet Take 1 tablet (800 mg total) by mouth every 8 (eight) hours as needed. (Patient taking differently: Take 800 mg by mouth every 8 (eight) hours as needed for cramping (menstrual pain relief).)   levothyroxine (SYNTHROID) 75 MCG tablet Take 1 tablet (75 mcg total) by mouth daily before breakfast.   ondansetron (ZOFRAN-ODT) 4 MG disintegrating tablet Take 1 tablet (4 mg total) by mouth every 8 (eight) hours as needed for nausea or vomiting.   No facility-administered encounter medications on file as of 08/15/2023.      Medical History: Past Medical History:  Diagnosis Date   Vitamin D deficiency      Vital Signs: BP (!) 160/106   Pulse 85   Temp 98.4 F (36.9 C)   Resp 16   Ht 5\' 7"  (1.702 m)   Wt (!) 428 lb 3.2 oz (194.2 kg)   SpO2 99%   BMI 67.07 kg/m    Review of Systems  HENT: Negative.    Respiratory:  Positive for cough (dry).   Cardiovascular:   Positive for leg swelling (left leg lymphedema). Negative for chest pain and palpitations.  Gastrointestinal: Negative.   Musculoskeletal: Negative.   Skin:  Positive for rash.    Physical Exam Constitutional:      Appearance: Normal appearance.  HENT:     Head: Normocephalic and atraumatic.  Eyes:     Pupils: Pupils are equal, round, and reactive to light.  Cardiovascular:     Rate and Rhythm: Normal rate and regular rhythm.  Pulmonary:     Effort: Pulmonary effort is normal. No respiratory distress.  Skin:    Findings: Rash present.  Neurological:     Mental Status: She is alert and oriented to person, place, and time.  Psychiatric:        Mood and Affect: Mood normal.        Behavior: Behavior normal.       Assessment/Plan: 1. Seasonal allergic rhinitis, unspecified trigger Referred to allergist - Ambulatory referral to Allergy  2. Pruritic erythematous rash Topical prescription for rash and itching - triamcinolone cream (KENALOG) 0.1 %; Apply 1 Application topically 2 (two) times daily.  Dispense: 45 g; Refill: 2  3. Lymphedema of left lower extremity Order custom stockings from DME company - [employer]   General Counseling: Betty Fisher understanding of the findings of todays visit and agrees with plan of treatment. I have  discussed any further diagnostic evaluation that may be needed or ordered today. We also reviewed her medications today. she has been encouraged to call the office with any questions or concerns that should arise related to todays visit.    Counseling:    Orders Placed This Encounter  Procedures   Ambulatory referral to Allergy   CUSTOM COMPRESSION STOCKINGS    Meds ordered this encounter  Medications   triamcinolone cream (KENALOG) 0.1 %    Sig: Apply 1 Application topically 2 (two) times daily.    Dispense:  45 g    Refill:  2    Return if symptoms worsen or fail to improve.  [place] Controlled Substance  Database was reviewed by me for overdose risk score (ORS)  Time spent:30 Minutes Time spent with patient included reviewing progress notes, labs, imaging studies, and discussing plan for follow up.   This patient was seen by Sallyanne Kuster, FNP-C in collaboration with Dr. Beverely Risen as a part of collaborative care agreement.  Shota Kohrs R. Tedd Sias, MSN, FNP-C Internal Medicine

## 2023-08-16 ENCOUNTER — Telehealth: Payer: Self-pay | Admitting: Nurse Practitioner

## 2023-08-16 NOTE — Telephone Encounter (Signed)
Awaiting 08/15/23 office notes for Allergy referral-[facility]

## 2023-08-31 ENCOUNTER — Encounter: Payer: Self-pay | Admitting: Nurse Practitioner

## 2023-09-03 ENCOUNTER — Telehealth: Payer: Self-pay | Admitting: Nurse Practitioner

## 2023-09-03 NOTE — Telephone Encounter (Signed)
Allergy referral sent via [employer] to [facility]. Notified patient. Gave pt telephone # [phone]

## 2023-10-08 ENCOUNTER — Telehealth: Payer: BC Managed Care – PPO | Admitting: Physician Assistant

## 2023-10-08 DIAGNOSIS — B9689 Other specified bacterial agents as the cause of diseases classified elsewhere: Secondary | ICD-10-CM

## 2023-10-08 DIAGNOSIS — J208 Acute bronchitis due to other specified organisms: Secondary | ICD-10-CM

## 2023-10-08 MED ORDER — ALBUTEROL SULFATE HFA 108 (90 BASE) MCG/ACT IN AERS: 1.0000 | INHALATION_SPRAY | Freq: Four times a day (QID) | RESPIRATORY_TRACT | 0 refills | Status: DC | PRN

## 2023-10-08 MED ORDER — PREDNISONE 20 MG PO TABS: 40.0000 mg | ORAL_TABLET | Freq: Every day | ORAL | 0 refills | Status: DC

## 2023-10-08 MED ORDER — AZITHROMYCIN 250 MG PO TABS: ORAL_TABLET | ORAL | 0 refills | Status: AC

## 2023-10-08 MED ORDER — BENZONATATE 100 MG PO CAPS: 100.0000 mg | ORAL_CAPSULE | Freq: Three times a day (TID) | ORAL | 0 refills | Status: DC | PRN

## 2023-10-08 NOTE — Progress Notes (Signed)
E-Visit for Cough   We are sorry that you are not feeling well.  Here is how we plan to help!  Based on your presentation I believe you most likely have A cough due to bacteria.  When patients have a fever and a productive cough with a change in color or increased sputum production, we are concerned about bacterial bronchitis.  If left untreated it can progress to pneumonia.  If your symptoms do not improve with your treatment plan it is important that you contact your provider.   I have prescribed Azithromyin 250 mg: two tablets now and then one tablet daily for 4 additonal days    In addition you may use A non-prescription cough medication called Mucinex DM: take 2 tablets every 12 hours. and A prescription cough medication called Tessalon Perles 100mg . You may take 1-2 capsules every 8 hours as needed for your cough.  I have prescribed Prednisone 20mg  Take 2 tablets (40mg ) daily with food for 5 days. AND Albuterol inhaler Use 1-2 puffs every 6 hours as needed for shortness of breath, chest tightness, and/or wheezing.   From your responses in the eVisit questionnaire you describe inflammation in the upper respiratory tract which is causing a significant cough.  This is commonly called Bronchitis and has four common causes:   Allergies Viral Infections Acid Reflux Bacterial Infection Allergies, viruses and acid reflux are treated by controlling symptoms or eliminating the cause. An example might be a cough caused by taking certain blood pressure medications. You stop the cough by changing the medication. Another example might be a cough caused by acid reflux. Controlling the reflux helps control the cough.  USE OF BRONCHODILATOR ("RESCUE") INHALERS: There is a risk from using your bronchodilator too frequently.  The risk is that over-reliance on a medication which only relaxes the muscles surrounding the breathing tubes can reduce the effectiveness of medications prescribed to reduce swelling  and congestion of the tubes themselves.  Although you feel brief relief from the bronchodilator inhaler, your asthma may actually be worsening with the tubes becoming more swollen and filled with mucus.  This can delay other crucial treatments, such as oral steroid medications. If you need to use a bronchodilator inhaler daily, several times per day, you should discuss this with your provider.  There are probably better treatments that could be used to keep your asthma under control.     HOME CARE Only take medications as instructed by your medical team. Complete the entire course of an antibiotic. Drink plenty of fluids and get plenty of rest. Avoid close contacts especially the very young and the elderly Cover your mouth if you cough or cough into your sleeve. Always remember to wash your hands A steam or ultrasonic humidifier can help congestion.   GET HELP RIGHT AWAY IF: You develop worsening fever. You become short of breath You cough up blood. Your symptoms persist after you have completed your treatment plan MAKE SURE YOU  Understand these instructions. Will watch your condition. Will get help right away if you are not doing well or get worse.    Thank you for choosing an e-visit.  Your e-visit answers were reviewed by a board certified advanced clinical practitioner to complete your personal care plan. Depending upon the condition, your plan could have included both over the counter or prescription medications.  Please review your pharmacy choice. Make sure the pharmacy is open so you can pick up prescription now. If there is a problem, you may  contact your provider through [employer] and have the prescription routed to another pharmacy.  Your safety is important to [place]. If you have drug allergies check your prescription carefully.   For the next 24 hours you can use MyChart to ask questions about today's visit, request a non-urgent call back, or ask for a work or school  excuse. You will get an email in the next two days asking about your experience. I hope that your e-visit has been valuable and will speed your recovery.   I have spent 5 minutes in review of e-visit questionnaire, review and updating patient chart, medical decision making and response to patient.   Margaretann Loveless, PA-C

## 2023-10-10 ENCOUNTER — Telehealth: Payer: Self-pay

## 2023-10-10 ENCOUNTER — Encounter: Payer: Self-pay | Admitting: Nurse Practitioner

## 2023-10-10 NOTE — Telephone Encounter (Signed)
Faxed order to [facility]  stock[facility]

## 2023-10-18 ENCOUNTER — Telehealth: Payer: Self-pay | Admitting: Nurse Practitioner

## 2023-10-18 NOTE — Telephone Encounter (Signed)
Allergy appointment 09/21/23 @ [facility]

## 2023-10-23 ENCOUNTER — Telehealth: Payer: BC Managed Care – PPO | Admitting: Nurse Practitioner

## 2023-10-23 ENCOUNTER — Telehealth: Payer: BC Managed Care – PPO | Admitting: Physician Assistant

## 2023-10-23 DIAGNOSIS — B9789 Other viral agents as the cause of diseases classified elsewhere: Secondary | ICD-10-CM | POA: Diagnosis not present

## 2023-10-23 DIAGNOSIS — J019 Acute sinusitis, unspecified: Secondary | ICD-10-CM

## 2023-10-23 DIAGNOSIS — J111 Influenza due to unidentified influenza virus with other respiratory manifestations: Secondary | ICD-10-CM

## 2023-10-23 MED ORDER — OSELTAMIVIR PHOSPHATE 75 MG PO CAPS: 75.0000 mg | ORAL_CAPSULE | Freq: Two times a day (BID) | ORAL | 0 refills | Status: AC

## 2023-10-23 MED ORDER — IPRATROPIUM BROMIDE 0.03 % NA SOLN: 2.0000 | Freq: Two times a day (BID) | NASAL | 0 refills | Status: DC

## 2023-10-23 NOTE — Progress Notes (Signed)
E visit for Flu like symptoms   We are sorry that you are not feeling well.  Here is how we plan to help! Based on what you have shared with me it looks like you may have a respiratory virus that may be influenza.  Influenza or "the flu" is   an infection caused by a respiratory virus. The flu virus is highly contagious and persons who did not receive their yearly flu vaccination may "catch" the flu from close contact.  We have anti-viral medications to treat the viruses that cause this infection. They are not a "cure" and only shorten the course of the infection. These prescriptions are most effective when they are given within the first 2 days of "flu" symptoms. Antiviral medication are indicated if you have a high risk of complications from the flu. You should  also consider an antiviral medication if you are in close contact with someone who is at risk. These medications can help patients avoid complications from the flu  but have side effects that you should know. Possible side effects from Tamiflu or oseltamivir include nausea, vomiting, diarrhea, dizziness, headaches, eye redness, sleep problems or other respiratory symptoms. You should not take Tamiflu if you have an allergy to oseltamivir or any to the ingredients in Tamiflu.  Based upon your symptoms and potential risk factors I have prescribed Oseltamivir (Tamiflu).  It has been sent to your designated pharmacy.  You will take one 75 mg capsule orally twice a day for the next 5 days.  ANYONE WHO HAS FLU SYMPTOMS SHOULD: . Stay home. The flu is highly contagious and going out or to work exposes others! . Be sure to drink plenty of fluids. Water is fine as well as fruit juices, sodas and electrolyte beverages. You may want to stay away from caffeine or alcohol. If you are nauseated, try taking small sips of liquids. How do you know if you are getting enough fluid? Your urine should be a pale yellow or almost colorless. . Get rest. . Taking a  steamy shower or using a humidifier may help nasal congestion and ease sore throat pain. Using a saline nasal spray works much the same way. . Cough drops, hard candies and sore throat lozenges may ease your cough. . Line up a caregiver. Have someone check on you regularly.   GET HELP RIGHT AWAY IF: . You cannot keep down liquids or your medications. . You become short of breath . Your fell like you are going to pass out or loose consciousness. . Your symptoms persist after you have completed your treatment plan MAKE SURE YOU   Understand these instructions.  Will watch your condition.  Will get help right away if you are not doing well or get worse.  Your e-visit answers were reviewed by a board certified advanced clinical practitioner to complete your personal care plan.  Depending on the condition, your plan could have included both over the counter or prescription medications.  If there is a problem please reply  once you have received a response from your provider.  Your safety is important to us.  If you have drug allergies check your prescription carefully.    You can use MyChart to ask questions about today's visit, request a non-urgent call back, or ask for a work or school excuse for 24 hours related to this e-Visit. If it has been greater than 24 hours you will need to follow up with your provider, or enter a new e-Visit   two days asking about your experience.  I hope that your e-visit has been valuable and will speed your recovery. Thank you for using e-visits.   Meds ordered this encounter  Medications   oseltamivir (TAMIFLU) 75 MG capsule    Sig: Take 1 capsule (75 mg total) by mouth 2 (two) times daily for 5 days.    Dispense:  10 capsule    Refill:  0     I spent approximately 5 minutes reviewing the patient's history, current symptoms and coordinating their care today.   

## 2023-10-23 NOTE — Progress Notes (Signed)
E-Visit for Sinus Problems  We are sorry that you are not feeling well.  Here is how we plan to help!  Based on what you have shared with me it looks like you have sinusitis.  Sinusitis is inflammation and infection in the sinus cavities of the head.  Based on your presentation I believe you most likely have Acute Viral Sinusitis.This is an infection most likely caused by a virus. There is not specific treatment for viral sinusitis other than to help you with the symptoms until the infection runs its course.  You may use an oral decongestant such as Mucinex D or if you have glaucoma or high blood pressure use plain Mucinex. Saline nasal spray help and can safely be used as often as needed for congestion, I have prescribed: Ipratropium Bromide nasal spray 0.03% 2 sprays in eah nostril 2-3 times a day  Some authorities believe that zinc sprays or the use of Echinacea may shorten the course of your symptoms.  Sinus infections are not as easily transmitted as other respiratory infection, however we still recommend that you avoid close contact with loved ones, especially the very young and elderly.  Remember to wash your hands thoroughly throughout the day as this is the number one way to prevent the spread of infection!  Home Care: Only take medications as instructed by your medical team. Do not take these medications with alcohol. A steam or ultrasonic humidifier can help congestion.  You can place a towel over your head and breathe in the steam from hot water coming from a faucet. Avoid close contacts especially the very young and the elderly. Cover your mouth when you cough or sneeze. Always remember to wash your hands.  Get Help Right Away If: You develop worsening fever or sinus pain. You develop a severe head ache or visual changes. Your symptoms persist after you have completed your treatment plan.  Make sure you Understand these instructions. Will watch your condition. Will get help  right away if you are not doing well or get worse.   Thank you for choosing an e-visit.  Your e-visit answers were reviewed by a board certified advanced clinical practitioner to complete your personal care plan. Depending upon the condition, your plan could have included both over the counter or prescription medications.  Please review your pharmacy choice. Make sure the pharmacy is open so you can pick up prescription now. If there is a problem, you may contact your provider through MyChart messaging and have the prescription routed to another pharmacy.  Your safety is important to us. If you have drug allergies check your prescription carefully.   For the next 24 hours you can use MyChart to ask questions about today's visit, request a non-urgent call back, or ask for a work or school excuse. You will get an email in the next two days asking about your experience. I hope that your e-visit has been valuable and will speed your recovery.   

## 2023-10-23 NOTE — Progress Notes (Signed)
I have spent 5 minutes in review of e-visit questionnaire, review and updating patient chart, medical decision making and response to patient.   Mia Milan Cody Jacklynn Dehaas, PA-C    

## 2023-10-25 ENCOUNTER — Telehealth: Payer: BC Managed Care – PPO | Admitting: Physician Assistant

## 2023-10-25 DIAGNOSIS — R0981 Nasal congestion: Secondary | ICD-10-CM

## 2023-10-25 MED ORDER — FLUTICASONE PROPIONATE 50 MCG/ACT NA SUSP: 2.0000 | Freq: Every day | NASAL | 0 refills | Status: DC

## 2023-10-25 NOTE — Progress Notes (Signed)
E-Visit for Sinus Problems  We are sorry that you are not feeling well.  Here is how we plan to help!  Based on what you have shared with me it looks like you have sinusitis.  Sinusitis is inflammation and infection in the sinus cavities of the head.  Based on your presentation I believe you most likely have Acute Viral Sinusitis.This is an infection most likely caused by a virus. There is not specific treatment for viral sinusitis other than to help you with the symptoms until the infection runs its course.  You may use an oral decongestant such as Mucinex D or if you have glaucoma or high blood pressure use plain Mucinex. Saline nasal spray help and can safely be used as often as needed for congestion, I have prescribed: Fluticasone nasal spray two sprays in each nostril once a day for 10 -14 days.  Some authorities believe that zinc sprays or the use of Echinacea may shorten the course of your symptoms.  Sinus infections are not as easily transmitted as other respiratory infection, however we still recommend that you avoid close contact with loved ones, especially the very young and elderly.  Remember to wash your hands thoroughly throughout the day as this is the number one way to prevent the spread of infection!  Home Care: Only take medications as instructed by your medical team. Do not take these medications with alcohol. A steam or ultrasonic humidifier can help congestion.  You can place a towel over your head and breathe in the steam from hot water coming from a faucet. Avoid close contacts especially the very young and the elderly. Cover your mouth when you cough or sneeze. Always remember to wash your hands.  Get Help Right Away If: You develop worsening fever or sinus pain. You develop a severe head ache or visual changes. Your symptoms persist after you have completed your treatment plan.  Make sure you Understand these instructions. Will watch your condition. Will get help  right away if you are not doing well or get worse.   Thank you for choosing an e-visit.  Your e-visit answers were reviewed by a board certified advanced clinical practitioner to complete your personal care plan. Depending upon the condition, your plan could have included both over the counter or prescription medications.  Please review your pharmacy choice. Make sure the pharmacy is open so you can pick up prescription now. If there is a problem, you may contact your provider through MyChart messaging and have the prescription routed to another pharmacy.  Your safety is important to us. If you have drug allergies check your prescription carefully.   For the next 24 hours you can use MyChart to ask questions about today's visit, request a non-urgent call back, or ask for a work or school excuse. You will get an email in the next two days asking about your experience. I hope that your e-visit has been valuable and will speed your recovery.   I have spent 5 minutes in review of e-visit questionnaire, review and updating patient chart, medical decision making and response to patient.   Nakayla Rorabaugh M Cyrstal Leitz, PA-C  

## 2023-11-23 ENCOUNTER — Encounter: Payer: Self-pay | Admitting: Physician Assistant

## 2023-11-25 ENCOUNTER — Telehealth: Payer: 59 | Admitting: Family Medicine

## 2023-11-25 DIAGNOSIS — A084 Viral intestinal infection, unspecified: Secondary | ICD-10-CM

## 2023-11-25 MED ORDER — ONDANSETRON HCL 4 MG PO TABS: 4.0000 mg | ORAL_TABLET | Freq: Three times a day (TID) | ORAL | 0 refills | Status: DC | PRN

## 2023-11-25 NOTE — Progress Notes (Signed)
E-Visit for Nausea and Vomiting   We are sorry that you are not feeling well. Here is how we plan to help!  Based on what you have shared with me it looks like you have a Virus that is irritating your GI tract.  Vomiting is the forceful emptying of a portion of the stomach's content through the mouth.  Although nausea and vomiting can make you feel miserable, it's important to remember that these are not diseases, but rather symptoms of an underlying illness.  When we treat short term symptoms, we always caution that any symptoms that persist should be fully evaluated in a medical office.  I have prescribed a medication that will help alleviate your symptoms and allow you to stay hydrated:  Zofran 4 mg 1 tablet every 8 hours as needed for nausea and vomiting  HOME CARE: Drink clear liquids.  This is very important! Dehydration (the lack of fluid) can lead to a serious complication.  Start off with 1 tablespoon every 5 minutes for 8 hours. You may begin eating bland foods after 8 hours without vomiting.  Start with saltine crackers, white bread, rice, mashed potatoes, applesauce. After 48 hours on a bland diet, you may resume a normal diet. Try to go to sleep.  Sleep often empties the stomach and relieves the need to vomit.  GET HELP RIGHT AWAY IF:  Your symptoms do not improve or worsen within 2 days after treatment. You have a fever for over 3 days. You cannot keep down fluids after trying the medication.  MAKE SURE YOU:  Understand these instructions. Will watch your condition. Will get help right away if you are not doing well or get worse.    Thank you for choosing an e-visit.  Your e-visit answers were reviewed by a board certified advanced clinical practitioner to complete your personal care plan. Depending upon the condition, your plan could have included both over the counter or prescription medications.  Please review your pharmacy choice. Make sure the pharmacy is open so  you can pick up prescription now. If there is a problem, you may contact your provider through [employer] and have the prescription routed to another pharmacy.  Your safety is important to [place]. If you have drug allergies check your prescription carefully.   For the next 24 hours you can use MyChart to ask questions about today's visit, request a non-urgent call back, or ask for a work or school excuse. You will get an email in the next two days asking about your experience. I hope that your e-visit has been valuable and will speed your recovery.    have provided 5 minutes of non face to face time during this encounter for chart review and documentation.

## 2023-11-29 ENCOUNTER — Encounter: Payer: Self-pay | Admitting: Nurse Practitioner

## 2023-12-08 ENCOUNTER — Telehealth: Payer: 59 | Admitting: Nurse Practitioner

## 2023-12-08 DIAGNOSIS — B9789 Other viral agents as the cause of diseases classified elsewhere: Secondary | ICD-10-CM | POA: Diagnosis not present

## 2023-12-08 DIAGNOSIS — J019 Acute sinusitis, unspecified: Secondary | ICD-10-CM | POA: Diagnosis not present

## 2023-12-08 NOTE — Progress Notes (Signed)
 E-Visit for Sinus Problems  We are sorry that you are not feeling well.  Here is how we plan to help!  Providers prescribe antibiotics to treat infections caused by bacteria. Antibiotics are very powerful in treating bacterial infections when they are used properly. To maintain their effectiveness, they should be used only when necessary. Overuse of antibiotics has resulted in the development of superbugs that are resistant to treatment!    After careful review of your answers, I would not recommend an antibiotic for your condition.  Antibiotics are not effective against viruses and therefore should not be used to treat them. Common examples of infections caused by viruses include colds and flu  Please feel free to reach back out to us  if you are not feeling better within the next 5-7 days  Based on what you have shared with me it looks like you have sinusitis.  Sinusitis is inflammation and infection in the sinus cavities of the head.  Based on your presentation I believe you most likely have Acute Viral Sinusitis.This is an infection most likely caused by a virus. There is not specific treatment for viral sinusitis other than to help you with the symptoms until the infection runs its course.  You may use an oral decongestant such as Mucinex D or if you have glaucoma or high blood pressure use plain Mucinex. Saline nasal spray help and can safely be used as often as needed for congestion, I recommend you continue Fluticasone  nasal spray two sprays in each nostril once a day  Some authorities believe that zinc sprays or the use of Echinacea may shorten the course of your symptoms.  Sinus infections are not as easily transmitted as other respiratory infection, however we still recommend that you avoid close contact with loved ones, especially the very young and elderly.  Remember to wash your hands thoroughly throughout the day as this is the number one way to prevent the spread of infection!  Home  Care: Only take medications as instructed by your medical team. Do not take these medications with alcohol. A steam or ultrasonic humidifier can help congestion.  You can place a towel over your head and breathe in the steam from hot water coming from a faucet. Avoid close contacts especially the very young and the elderly. Cover your mouth when you cough or sneeze. Always remember to wash your hands.  Get Help Right Away If: You develop worsening fever or sinus pain. You develop a severe head ache or visual changes. Your symptoms persist after you have completed your treatment plan.  Make sure you Understand these instructions. Will watch your condition. Will get help right away if you are not doing well or get worse.   Thank you for choosing an e-visit.  Your e-visit answers were reviewed by a board certified advanced clinical practitioner to complete your personal care plan. Depending upon the condition, your plan could have included both over the counter or prescription medications.  Please review your pharmacy choice. Make sure the pharmacy is open so you can pick up prescription now. If there is a problem, you may contact your provider through [employer] and have the prescription routed to another pharmacy.  Your safety is important to us . If you have drug allergies check your prescription carefully.   For the next 24 hours you can use MyChart to ask questions about today's visit, request a non-urgent call back, or ask for a work or school excuse. You will get an email in  the next two days asking about your experience. I hope that your e-visit has been valuable and will speed your recovery.

## 2023-12-08 NOTE — Progress Notes (Signed)
 I have spent 5 minutes in review of e-visit questionnaire, review and updating patient chart, medical decision making and response to patient.   Claiborne Rigg, NP

## 2023-12-20 IMAGING — US US EXTREM LOW*L* LIMITED
1 series · 14 of 25 positions shown · non-contrast
Comparison: None.

CLINICAL DATA: Wound in the skin soft tissue swelling in the left
anterior lower leg

EXAM:
ULTRASOUND left LOWER EXTREMITY LIMITED
TECHNIQUE: Ultrasound examination of the lower extremity soft tissues was
performed in the area of clinical concern.

[Series 1: us extrem low*left* limited · 0.10mm/px · 38 acquisitions, 14 frames shown]
[im 1/38]
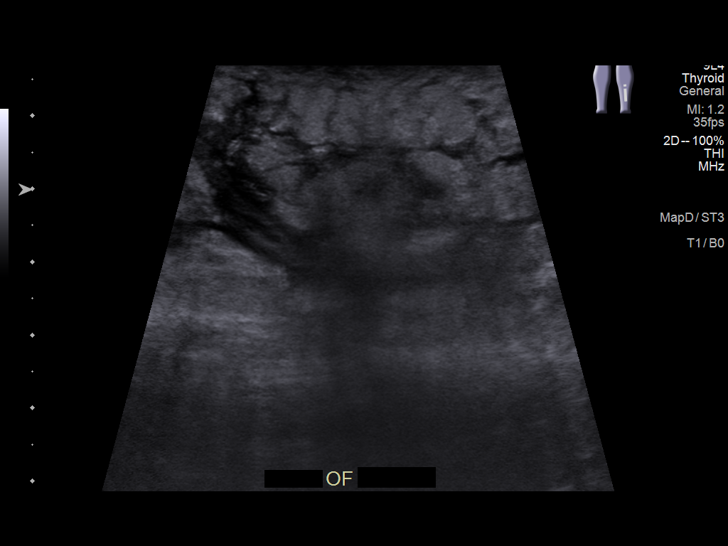
[im 4/38]
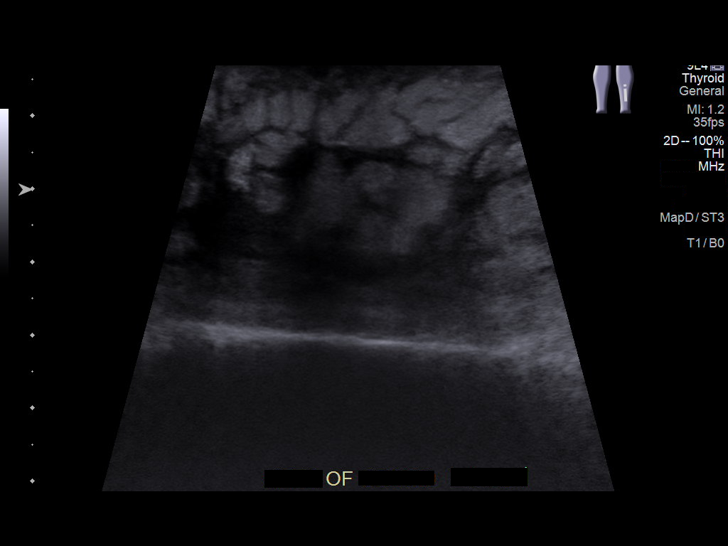
[im 7/38]
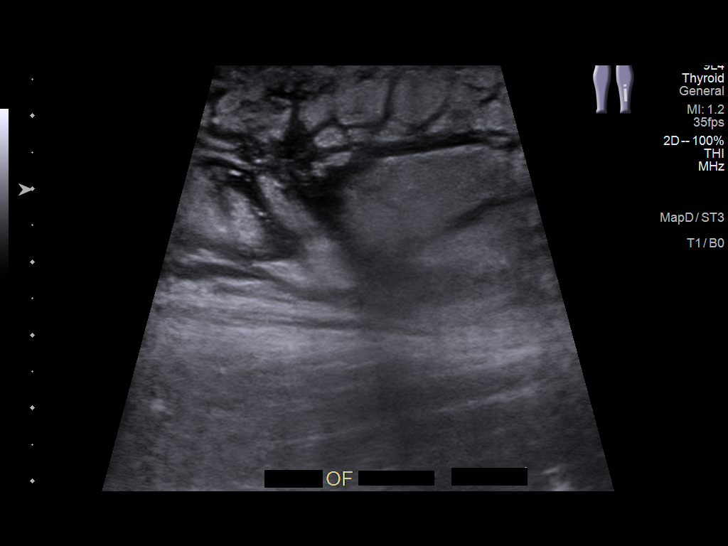
[im 10/38]
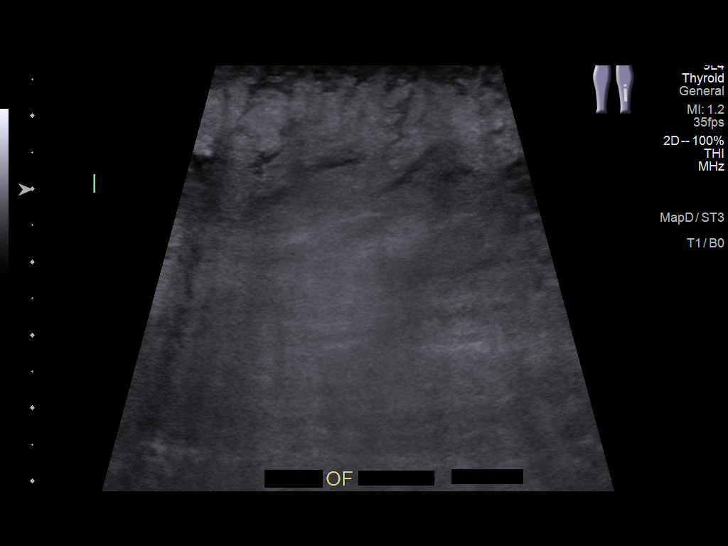
[im 13/38]
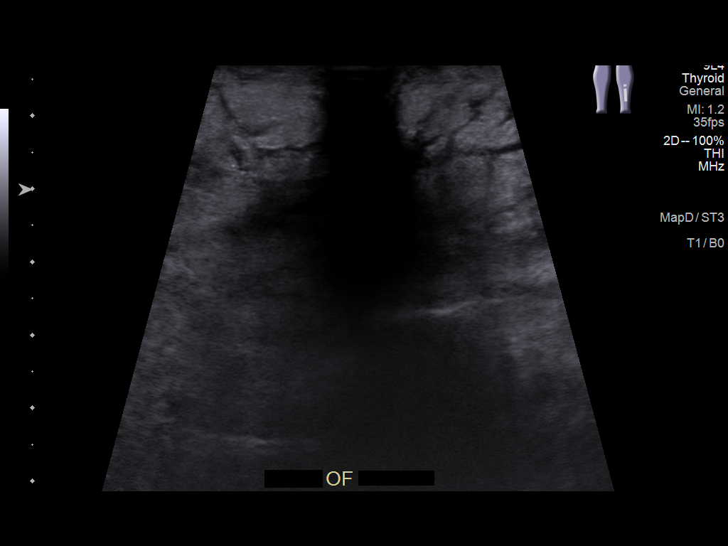
[im 14/38]
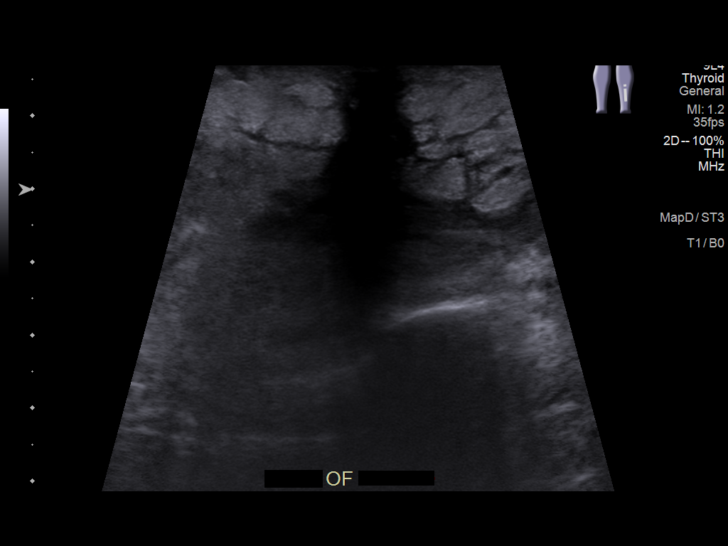
[im 17/38]
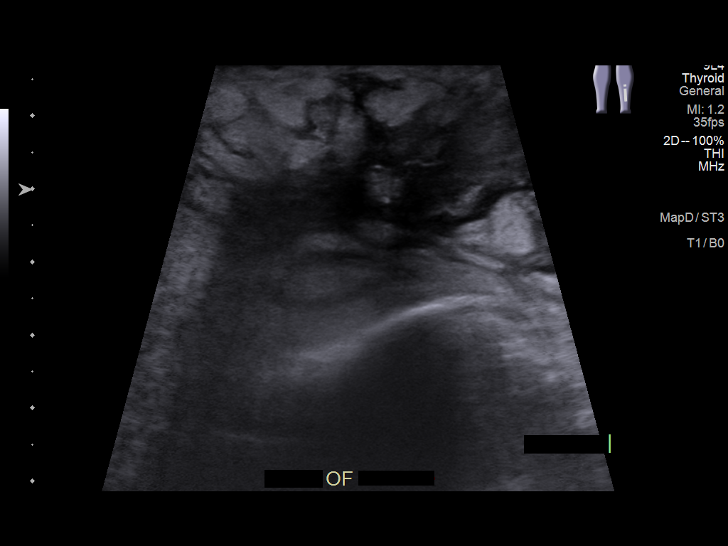
[im 21/38]
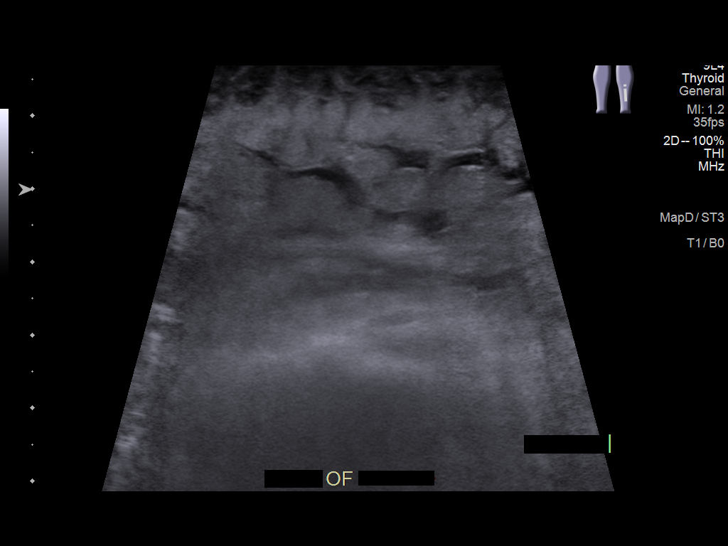
[im 24/38]
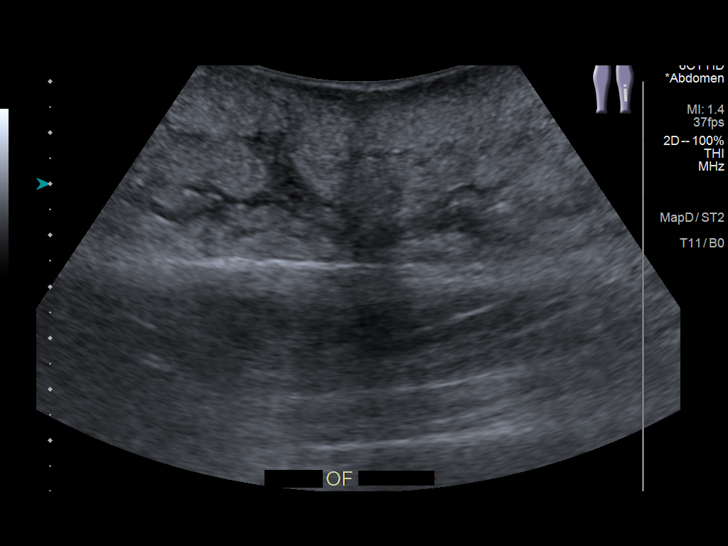
[im 25/38]
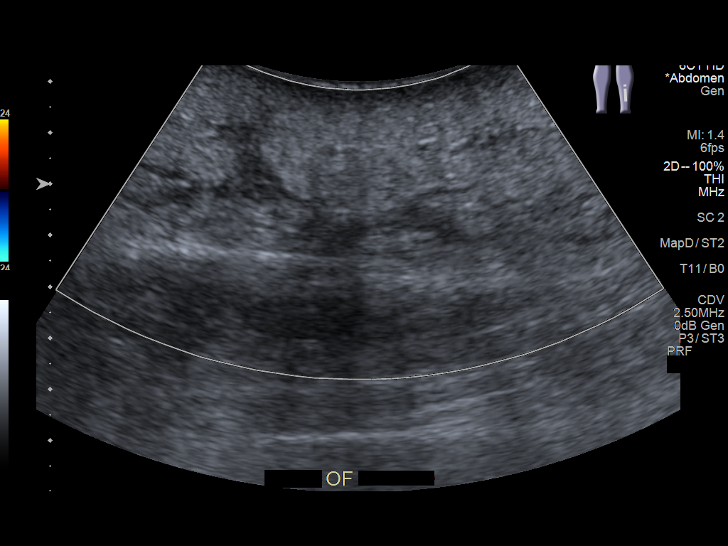
[im 28/38]
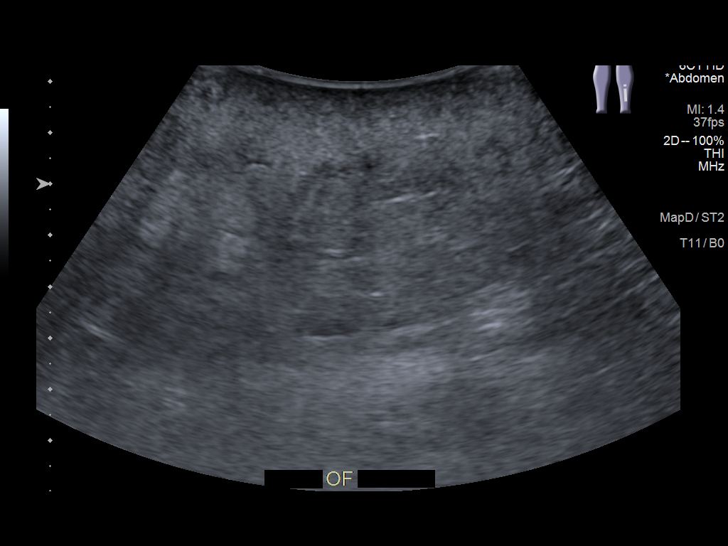
[im 31/38]
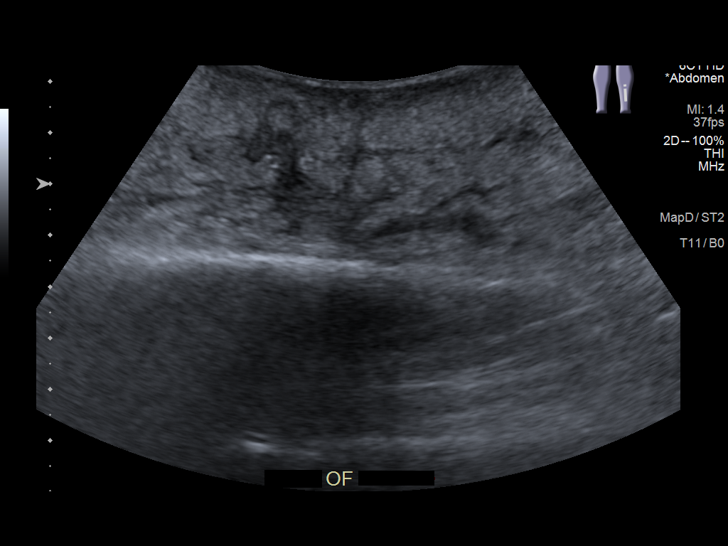
[im 34/38]
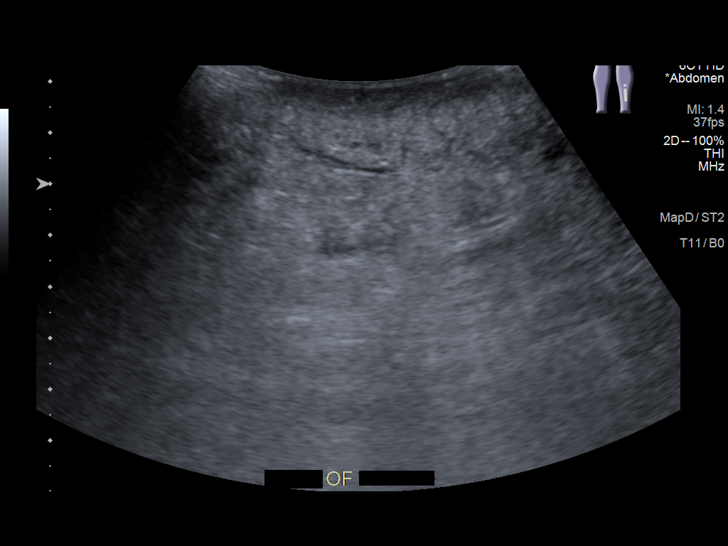
[im 38/38]
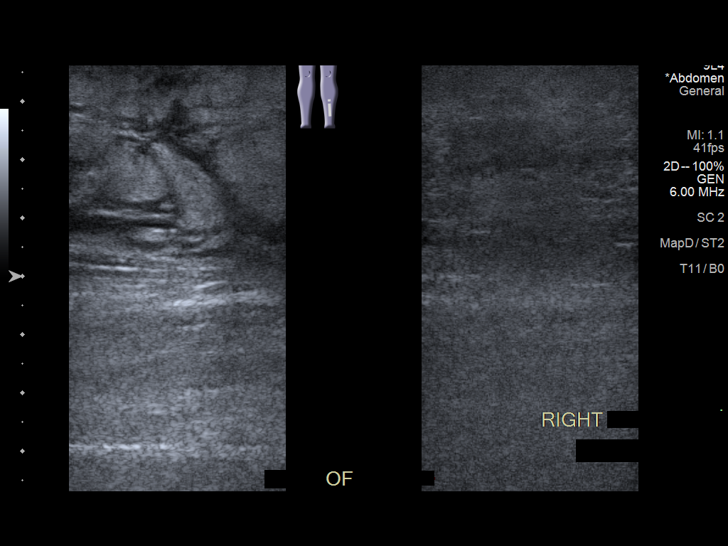

[14 of 25 positions shown; findings below may reference images not displayed]

FINDINGS: There is diffuse edema in the subcutaneous plane in the area of
patient's symptoms in the anterior left lower leg without any
loculated fluid collections.
IMPRESSION: There is diffuse edema in the subcutaneous plane in the area of
patient's symptoms in the anterior left lower leg suggesting
contusion/cellulitis. There is no loculated fluid collection or
abnormal soft tissue mass in the area of patient's symptoms.

## 2024-02-01 ENCOUNTER — Telehealth: Admitting: Physician Assistant

## 2024-02-01 DIAGNOSIS — J208 Acute bronchitis due to other specified organisms: Secondary | ICD-10-CM | POA: Diagnosis not present

## 2024-02-01 MED ORDER — PREDNISONE 20 MG PO TABS
40.0000 mg | ORAL_TABLET | Freq: Every day | ORAL | 0 refills | Status: DC
Start: 2024-02-01 — End: 2024-03-19

## 2024-02-01 MED ORDER — ALBUTEROL SULFATE HFA 108 (90 BASE) MCG/ACT IN AERS: 1.0000 | INHALATION_SPRAY | Freq: Four times a day (QID) | RESPIRATORY_TRACT | 0 refills | Status: DC | PRN

## 2024-02-01 MED ORDER — PSEUDOEPH-BROMPHEN-DM 30-2-10 MG/5ML PO SYRP: 5.0000 mL | ORAL_SOLUTION | Freq: Four times a day (QID) | ORAL | 0 refills | Status: DC | PRN

## 2024-02-01 NOTE — Progress Notes (Signed)
 E-Visit for Cough   We are sorry that you are not feeling well.  Here is how we plan to help!  Based on your presentation I believe you most likely have A cough due to a virus.  This is called viral bronchitis and is best treated by rest, plenty of fluids and control of the cough.  You may use Ibuprofen or Tylenol as directed to help your symptoms.     In addition you may use Bromfed DM cough syrup Take 5mL every 6 hours as needed for cough.   I have prescribed Prednisone 20mg  Take 2 tablets (40mg ) daily for 5 days.  I have prescribed Albuterol inhaler Use 1-2 puffs every 6 hours as needed for shortness of breath, chest tightness, and/or wheezing.  From your responses in the eVisit questionnaire you describe inflammation in the upper respiratory tract which is causing a significant cough.  This is commonly called Bronchitis and has four common causes:   Allergies Viral Infections Acid Reflux Bacterial Infection Allergies, viruses and acid reflux are treated by controlling symptoms or eliminating the cause. An example might be a cough caused by taking certain blood pressure medications. You stop the cough by changing the medication. Another example might be a cough caused by acid reflux. Controlling the reflux helps control the cough.  USE OF BRONCHODILATOR ("RESCUE") INHALERS: There is a risk from using your bronchodilator too frequently.  The risk is that over-reliance on a medication which only relaxes the muscles surrounding the breathing tubes can reduce the effectiveness of medications prescribed to reduce swelling and congestion of the tubes themselves.  Although you feel brief relief from the bronchodilator inhaler, your asthma may actually be worsening with the tubes becoming more swollen and filled with mucus.  This can delay other crucial treatments, such as oral steroid medications. If you need to use a bronchodilator inhaler daily, several times per day, you should discuss this with  your provider.  There are probably better treatments that could be used to keep your asthma under control.     HOME CARE Only take medications as instructed by your medical team. Complete the entire course of an antibiotic. Drink plenty of fluids and get plenty of rest. Avoid close contacts especially the very young and the elderly Cover your mouth if you cough or cough into your sleeve. Always remember to wash your hands A steam or ultrasonic humidifier can help congestion.   GET HELP RIGHT AWAY IF: You develop worsening fever. You become short of breath You cough up blood. Your symptoms persist after you have completed your treatment plan MAKE SURE YOU  Understand these instructions. Will watch your condition. Will get help right away if you are not doing well or get worse.    Thank you for choosing an e-visit.  Your e-visit answers were reviewed by a board certified advanced clinical practitioner to complete your personal care plan. Depending upon the condition, your plan could have included both over the counter or prescription medications.  Please review your pharmacy choice. Make sure the pharmacy is open so you can pick up prescription now. If there is a problem, you may contact your provider through [employer] and have the prescription routed to another pharmacy.  Your safety is important to [place]. If you have drug allergies check your prescription carefully.   For the next 24 hours you can use MyChart to ask questions about today's visit, request a non-urgent call back, or ask for a work or school  excuse. You will get an email in the next two days asking about your experience. I hope that your e-visit has been valuable and will speed your recovery.   I have spent 5 minutes in review of e-visit questionnaire, review and updating patient chart, medical decision making and response to patient.   Margaretann Loveless, PA-C

## 2024-02-02 ENCOUNTER — Other Ambulatory Visit: Payer: Self-pay | Admitting: Physician Assistant

## 2024-02-02 DIAGNOSIS — J208 Acute bronchitis due to other specified organisms: Secondary | ICD-10-CM

## 2024-02-03 MED ORDER — ALBUTEROL SULFATE HFA 108 (90 BASE) MCG/ACT IN AERS
1.0000 | INHALATION_SPRAY | Freq: Four times a day (QID) | RESPIRATORY_TRACT | 0 refills | Status: DC | PRN
Start: 2024-02-03 — End: 2024-03-19

## 2024-03-05 ENCOUNTER — Other Ambulatory Visit: Payer: Self-pay | Admitting: Family Medicine

## 2024-03-05 DIAGNOSIS — R2242 Localized swelling, mass and lump, left lower limb: Secondary | ICD-10-CM

## 2024-03-05 DIAGNOSIS — R2241 Localized swelling, mass and lump, right lower limb: Secondary | ICD-10-CM

## 2024-03-07 ENCOUNTER — Encounter: Payer: Self-pay | Admitting: Family Medicine

## 2024-03-10 ENCOUNTER — Other Ambulatory Visit

## 2024-03-11 ENCOUNTER — Ambulatory Visit
Admission: RE | Admit: 2024-03-11 | Discharge: 2024-03-11 | Disposition: A | Discharge status: Home / Self Care | Source: Ambulatory Visit | Attending: Family Medicine | Admitting: Family Medicine

## 2024-03-11 DIAGNOSIS — R2242 Localized swelling, mass and lump, left lower limb: Secondary | ICD-10-CM

## 2024-03-11 DIAGNOSIS — R2241 Localized swelling, mass and lump, right lower limb: Secondary | ICD-10-CM

## 2024-03-11 MED ORDER — IOPAMIDOL (ISOVUE-370) INJECTION 76%
125.0000 mL | Freq: Once | INTRAVENOUS | Status: AC | PRN
  Administered 2024-03-11: 125 mL via INTRAVENOUS

## 2024-03-17 ENCOUNTER — Telehealth: Admitting: Physician Assistant

## 2024-03-17 DIAGNOSIS — N898 Other specified noninflammatory disorders of vagina: Secondary | ICD-10-CM

## 2024-03-17 DIAGNOSIS — S3141XA Laceration without foreign body of vagina and vulva, initial encounter: Secondary | ICD-10-CM | POA: Diagnosis not present

## 2024-03-17 NOTE — Progress Notes (Signed)
 Virtual Visit Consent   Betty Fisher, you are scheduled for a virtual visit with a [facility] provider today. Just as with appointments in the office, your consent must be obtained to participate. Your consent will be active for this visit and any virtual visit you may have with one of our providers in the next 365 days. If you have a MyChart account, a copy of this consent can be sent to you electronically.  As this is a virtual visit, video technology does not allow for your provider to perform a traditional examination. This may limit your provider's ability to fully assess your condition. If your provider identifies any concerns that need to be evaluated in person or the need to arrange testing (such as labs, EKG, etc.), we will make arrangements to do so. Although advances in technology are sophisticated, we cannot ensure that it will always work on either your end or our end. If the connection with a video visit is poor, the visit may have to be switched to a telephone visit. With either a video or telephone visit, we are not always able to ensure that we have a secure connection.  By engaging in this virtual visit, you consent to the provision of healthcare and authorize for your insurance to be billed (if applicable) for the services provided during this visit. Depending on your insurance coverage, you may receive a charge related to this service.  I need to obtain your verbal consent now. Are you willing to proceed with your visit today? Betty Fisher has provided verbal consent on 03/17/2024 for a virtual visit (video or telephone). Char Common Ward, PA-C  Date: 03/17/2024 7:12 PM   Virtual Visit via Video Note   I, Char Common Ward, connected with  Betty Fisher  ([id number], 01/25/1996) on 03/17/24 at  7:15 PM EDT by a video-enabled telemedicine application and verified that I am speaking with the correct person using two identifiers.  Location: Patient: Virtual Visit Location Patient:  Home Provider: Virtual Visit Location Provider: Home Office   I discussed the limitations of evaluation and management by telemedicine and the availability of in person appointments. The patient expressed understanding and agreed to proceed.    History of Present Illness: Betty Fisher is a 28 y.o. who identifies as a female who was assigned female at birth, and is being seen today for complains of a tear/small cut to her labia that occurred during sexual intercourse last night.  Complains of burning when urination.  No bleeding at this time. Reports it is feeling somewhat better since occurring.  She reports sitting is uncomfortable. Denies swelling or drainage.   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   Morbid obesity with body mass index (BMI) of 50.0 to 59.9 in adult [facility]) 12/23/2017    Allergies: No Known Allergies Medications:  Current Outpatient Medications:    albuterol  (VENTOLIN  HFA) 108 (90 Base) MCG/ACT inhaler, Inhale 1-2 puffs into the lungs every 6 (six) hours as needed., Disp: 8 g, Rfl: 0   brompheniramine-pseudoephedrine-DM 30-2-10 MG/5ML syrup, Take 5 mLs by mouth 4 (four) times daily as needed., Disp: 120 mL, Rfl: 0   fluticasone  (FLONASE ) 50 MCG/ACT nasal spray, Place 2 sprays into both  nostrils daily., Disp: 16 g, Rfl: 0   ibuprofen  (ADVIL ) 800 MG tablet, Take 1 tablet (800 mg total) by mouth every 8 (eight) hours as needed. (Patient taking differently: Take 800 mg by mouth every 8 (eight) hours as needed for cramping (menstrual pain relief).), Disp: 30 tablet, Rfl: 3   levothyroxine  (SYNTHROID ) 75 MCG tablet, Take 1 tablet (75 mcg total) by mouth daily before breakfast., Disp: 90 tablet, Rfl: 0   predniSONE  (DELTASONE ) 20 MG  tablet, Take 2 tablets (40 mg total) by mouth daily with breakfast., Disp: 10 tablet, Rfl: 0  Observations/Objective: Patient is well-developed, well-nourished in no acute distress.  Resting comfortably at home.  Head is normocephalic, atraumatic.  No labored breathing.  Speech is clear and coherent with logical content.  Patient is alert and oriented at baseline.    Assessment and Plan: 1. Laceration of labia majora, initial encounter (Primary)  Advised sitz baths and topical triple antibiotic ointment.  Advised to watch for signs of infection and follow up for in person visit if she notices any signs of infection.   Follow Up Instructions: I discussed the assessment and treatment plan with the patient. The patient was provided an opportunity to ask questions and all were answered. The patient agreed with the plan and demonstrated an understanding of the instructions.  A copy of instructions were sent to the patient via MyChart unless otherwise noted below.   Patient has requested to receive PHI (AVS, Work Notes, etc) pertaining to this video visit through e-mail as they are currently without active MyChart. They have voiced understand that email is not considered secure and their health information could be viewed by someone other than the patient.   The patient was advised to call back or seek an in-person evaluation if the symptoms worsen or if the condition fails to improve as anticipated.    Char Common Ward, PA-C

## 2024-03-17 NOTE — Progress Notes (Signed)
   Thank you for the details you included in the comment boxes. Those details are very helpful in determining the best course of treatment for you and help us  to provide the best care. Because of having a possible vaginal tear or irritation, we recommend that you schedule a Virtual Urgent Care video visit in order for the provider to better assess what is going on.  The provider will be able to give you a more accurate diagnosis and treatment plan if we can more freely discuss your symptoms and with the addition of a virtual examination.   If you change your visit to a video visit, we will bill your insurance (similar to an office visit) and you will not be charged for this e-Visit. You will be able to stay at home and speak with the first available [facility] advanced practice provider. The link to do a video visit is in the drop down Menu tab of your Welcome screen in MyChart.       I have spent 5 minutes in review of e-visit questionnaire, review and updating patient chart, medical decision making and response to patient.   Angelia Kelp, PA-C

## 2024-03-17 NOTE — Progress Notes (Signed)
 Duplicate

## 2024-03-17 NOTE — Patient Instructions (Signed)
  Betty Fisher, thank you for joining Char Common Ward, PA-C for today's virtual visit.  While this provider is not your primary care provider (PCP), if your PCP is located in our provider database this encounter information will be shared with them immediately following your visit.   A [facility] MyChart account gives you access to today's visit and all your visits, tests, and labs performed at [facility] " click here if you don't have a [facility] MyChart account or go to [email or web address]  Consent: (Patient) Betty Fisher provided verbal consent for this virtual visit at the beginning of the encounter.  Current Medications:  Current Outpatient Medications:    albuterol  (VENTOLIN  HFA) 108 (90 Base) MCG/ACT inhaler, Inhale 1-2 puffs into the lungs every 6 (six) hours as needed., Disp: 8 g, Rfl: 0   brompheniramine-pseudoephedrine-DM 30-2-10 MG/5ML syrup, Take 5 mLs by mouth 4 (four) times daily as needed., Disp: 120 mL, Rfl: 0   fluticasone  (FLONASE ) 50 MCG/ACT nasal spray, Place 2 sprays into both nostrils daily., Disp: 16 g, Rfl: 0   ibuprofen  (ADVIL ) 800 MG tablet, Take 1 tablet (800 mg total) by mouth every 8 (eight) hours as needed. (Patient taking differently: Take 800 mg by mouth every 8 (eight) hours as needed for cramping (menstrual pain relief).), Disp: 30 tablet, Rfl: 3   levothyroxine  (SYNTHROID ) 75 MCG tablet, Take 1 tablet (75 mcg total) by mouth daily before breakfast., Disp: 90 tablet, Rfl: 0   predniSONE  (DELTASONE ) 20 MG tablet, Take 2 tablets (40 mg total) by mouth daily with breakfast., Disp: 10 tablet, Rfl: 0   Medications ordered in this encounter:  No orders of the defined types were placed in this encounter.    *If you need refills on other medications prior to your next appointment, please contact your pharmacy*  Follow-Up: Call back or seek an in-person evaluation if the symptoms worsen or if the condition fails to improve as  anticipated.  [facility] [phone]  Other Instructions Recommend sitz bath, warm baths.  Keep area clean.  Can apply triple antibiotic ointment.  If you notice swelling, drainage, or increased pain follow up for in person evaluation.    If you have been instructed to have an in-person evaluation today at a local Urgent Care facility, please use the link below. It will take you to a list of all of our available [facility], including address, phone number and hours of operation. Please do not delay care.  [facility]  If you or a family member do not have a primary care provider, use the link below to schedule a visit and establish care. When you choose a [facility] primary care physician or advanced practice provider, you gain a long-term partner in health. Find a Primary Care Provider  Learn more about [facility]'s in-office and virtual care options: [place] [facility] - Get Care Now

## 2024-03-17 NOTE — Progress Notes (Signed)
   Thank you for the details you included in the comment boxes. Those details are very helpful in determining the best course of treatment for you and help us  to provide the best care.Because this is not something we can properly assess via e-visit alone, we recommend that you schedule a Virtual Urgent Care video visit in order for the provider to better assess what is going on.  The provider will be able to give you a more accurate diagnosis and treatment plan if we can more freely discuss your symptoms and with the addition of a virtual examination.   If you change your visit to a video visit, we will bill your insurance (similar to an office visit) and you will not be charged for this e-Visit. You will be able to stay at home and speak with the first available [facility] advanced practice provider. The link to do a video visit is in the drop down Menu tab of your Welcome screen in MyChart.

## 2024-03-18 ENCOUNTER — Encounter (INDEPENDENT_AMBULATORY_CARE_PROVIDER_SITE_OTHER): Payer: Self-pay

## 2024-03-19 ENCOUNTER — Telehealth: Admitting: Physician Assistant

## 2024-03-19 DIAGNOSIS — B3731 Acute candidiasis of vulva and vagina: Secondary | ICD-10-CM

## 2024-03-19 DIAGNOSIS — R3989 Other symptoms and signs involving the genitourinary system: Secondary | ICD-10-CM

## 2024-03-19 MED ORDER — TERCONAZOLE 0.4 % VA CREA: 1.0000 | TOPICAL_CREAM | Freq: Every day | VAGINAL | 0 refills | Status: AC

## 2024-03-19 MED ORDER — NITROFURANTOIN MONOHYD MACRO 100 MG PO CAPS
100.0000 mg | ORAL_CAPSULE | Freq: Two times a day (BID) | ORAL | 0 refills | Status: DC
Start: 2024-03-19 — End: 2024-09-08

## 2024-03-19 NOTE — Addendum Note (Signed)
 Addended by: Angelia Kelp on: 03/19/2024 12:45 PM   Modules accepted: Orders

## 2024-03-19 NOTE — Progress Notes (Signed)

## 2024-03-20 ENCOUNTER — Telehealth: Admitting: Physician Assistant

## 2024-03-20 DIAGNOSIS — L089 Local infection of the skin and subcutaneous tissue, unspecified: Secondary | ICD-10-CM

## 2024-03-20 MED ORDER — DOXYCYCLINE HYCLATE 100 MG PO TABS: 100.0000 mg | ORAL_TABLET | Freq: Two times a day (BID) | ORAL | 0 refills | Status: AC

## 2024-03-20 NOTE — Progress Notes (Signed)
 E-Visit for Cellulitis  We are sorry that you are not feeling well. Here is how we plan to help!  Based on what you shared with me it looks like you have cellulitis.  Cellulitis looks like areas of skin redness, swelling, and warmth; it develops as a result of bacteria entering under the skin. Little red spots and/or bleeding can be seen in skin, and tiny surface sacs containing fluid can occur. Fever can be present. Cellulitis is almost always on one side of a body, and the lower limbs are the most common site of involvement.   I have prescribed:  Doxycycline  100mg  Take one pill twice daily x 10 days.  HOME CARE:  Take your medications as ordered and take all of them, even if the skin irritation appears to be healing.   GET HELP RIGHT AWAY IF:  Symptoms that don't begin to go away within 48 hours. Severe redness persists or worsens If the area turns color, spreads or swells. If it blisters and opens, develops yellow-brown crust or bleeds. You develop a fever or chills. If the pain increases or becomes unbearable.  Are unable to keep fluids and food down.  MAKE SURE YOU   Understand these instructions. Will watch your condition. Will get help right away if you are not doing well or get worse.  Thank you for choosing an e-visit.  Your e-visit answers were reviewed by a board certified advanced clinical practitioner to complete your personal care plan. Depending upon the condition, your plan could have included both over the counter or prescription medications.  Please review your pharmacy choice. Make sure the pharmacy is open so you can pick up prescription now. If there is a problem, you may contact your provider through [employer] and have the prescription routed to another pharmacy.  Your safety is important to us . If you have drug allergies check your prescription carefully.   For the next 24 hours you can use MyChart to ask questions about today's visit, request a  non-urgent call back, or ask for a work or school excuse. You will get an email in the next two days asking about your experience. I hope that your e-visit has been valuable and will speed your recovery.   I have spent 5 minutes in review of e-visit questionnaire, review and updating patient chart, medical decision making and response to patient.   Kadian Barcellos, PA-C

## 2024-05-06 ENCOUNTER — Encounter

## 2024-08-31 ENCOUNTER — Telehealth: Payer: Self-pay | Admitting: Family

## 2024-08-31 DIAGNOSIS — J209 Acute bronchitis, unspecified: Secondary | ICD-10-CM

## 2024-08-31 MED ORDER — BENZONATATE 100 MG PO CAPS: 100.0000 mg | ORAL_CAPSULE | Freq: Three times a day (TID) | ORAL | 0 refills | Status: DC | PRN

## 2024-08-31 MED ORDER — PREDNISONE 10 MG (21) PO TBPK: ORAL_TABLET | ORAL | 0 refills | Status: DC

## 2024-08-31 NOTE — Progress Notes (Signed)
 We are sorry that you are not feeling well.  Here is how we plan to help!  Based on your presentation I believe you most likely have A cough due to a virus.  This is called viral bronchitis and is best treated by rest, plenty of fluids and control of the cough.  You may use Ibuprofen or Tylenol as directed to help your symptoms.     In addition you may use A non-prescription cough medication called Robitussin DAC. Take 2 teaspoons every 8 hours or Delsym: take 2 teaspoons every 12 hours., A non-prescription cough medication called Mucinex  DM: take 2 tablets every 12 hours., and A prescription cough medication called Tessalon  Perles 100mg . You may take 1-2 capsules every 8 hours as needed for your cough.  Prednisone  10 mg daily for 6 days (see taper instructions below)  Directions for 6 day taper: Day 1: 2 tablets before breakfast, 1 after both lunch & dinner and 2 at bedtime Day 2: 1 tab before breakfast, 1 after both lunch & dinner and 2 at bedtime Day 3: 1 tab at each meal & 1 at bedtime Day 4: 1 tab at breakfast, 1 at lunch, 1 at bedtime Day 5: 1 tab at breakfast & 1 tab at bedtime Day 6: 1 tab at breakfast  From your responses in the eVisit questionnaire you describe inflammation in the upper respiratory tract which is causing a significant cough.  This is commonly called Bronchitis and has four common causes:   Allergies Viral Infections Acid Reflux Bacterial Infection Allergies, viruses and acid reflux are treated by controlling symptoms or eliminating the cause. An example might be a cough caused by taking certain blood pressure medications. You stop the cough by changing the medication. Another example might be a cough caused by acid reflux. Controlling the reflux helps control the cough.  USE OF BRONCHODILATOR (RESCUE) INHALERS: There is a risk from using your bronchodilator too frequently.  The risk is that over-reliance on a medication which only relaxes the muscles surrounding  the breathing tubes can reduce the effectiveness of medications prescribed to reduce swelling and congestion of the tubes themselves.  Although you feel brief relief from the bronchodilator inhaler, your asthma may actually be worsening with the tubes becoming more swollen and filled with mucus.  This can delay other crucial treatments, such as oral steroid medications. If you need to use a bronchodilator inhaler daily, several times per day, you should discuss this with your provider.  There are probably better treatments that could be used to keep your asthma under control.     HOME CARE Only take medications as instructed by your medical team. Complete the entire course of an antibiotic. Drink plenty of fluids and get plenty of rest. Avoid close contacts especially the very young and the elderly Cover your mouth if you cough or cough into your sleeve. Always remember to wash your hands A steam or ultrasonic humidifier can help congestion.   GET HELP RIGHT AWAY IF: You develop worsening fever. You become short of breath You cough up blood. Your symptoms persist after you have completed your treatment plan MAKE SURE YOU  Understand these instructions. Will watch your condition. Will get help right away if you are not doing well or get worse.  Your e-visit answers were reviewed by a board certified advanced clinical practitioner to complete your personal care plan.  Depending on the condition, your plan could have included both over the counter or prescription medications. If there  is a problem please reply  once you have received a response from your provider. Your safety is important to us .  If you have drug allergies check your prescription carefully.    You can use MyChart to ask questions about today's visit, request a non-urgent call back, or ask for a work or school excuse for 24 hours related to this e-Visit. If it has been greater than 24 hours you will need to follow up with your  provider, or enter a new e-Visit to address those concerns. You will get an e-mail in the next two days asking about your experience.  I hope that your e-visit has been valuable and will speed your recovery. Thank you for using e-visits.   I have spent 5 minutes in review of e-visit questionnaire, review and updating patient chart, medical decision making and response to patient.   Bari Learn, FNP

## 2024-09-08 ENCOUNTER — Telehealth: Admitting: Physician Assistant

## 2024-09-08 DIAGNOSIS — J208 Acute bronchitis due to other specified organisms: Secondary | ICD-10-CM | POA: Diagnosis not present

## 2024-09-08 DIAGNOSIS — B9689 Other specified bacterial agents as the cause of diseases classified elsewhere: Secondary | ICD-10-CM

## 2024-09-08 MED ORDER — AZITHROMYCIN 250 MG PO TABS: ORAL_TABLET | ORAL | 0 refills | Status: DC

## 2024-09-08 NOTE — Progress Notes (Signed)
 We are sorry that you are not feeling well.  Here is how we plan to help!  Based on your presentation I believe you most likely have A cough due to bacteria.  When patients have a fever and a productive cough with a change in color or increased sputum production, we are concerned about bacterial bronchitis.  If left untreated it can progress to pneumonia.  If your symptoms do not improve with your treatment plan it is important that you contact your provider.   I have prescribed Azithromyin 250 mg: two tablets now and then one tablet daily for 4 additonal days    In addition you may use A non-prescription cough medication called Mucinex DM: take 2 tablets every 12 hours.  From your responses in the eVisit questionnaire you describe inflammation in the upper respiratory tract which is causing a significant cough.  This is commonly called Bronchitis and has four common causes:   Allergies Viral Infections Acid Reflux Bacterial Infection Allergies, viruses and acid reflux are treated by controlling symptoms or eliminating the cause. An example might be a cough caused by taking certain blood pressure medications. You stop the cough by changing the medication. Another example might be a cough caused by acid reflux. Controlling the reflux helps control the cough.  USE OF BRONCHODILATOR (RESCUE) INHALERS: There is a risk from using your bronchodilator too frequently.  The risk is that over-reliance on a medication which only relaxes the muscles surrounding the breathing tubes can reduce the effectiveness of medications prescribed to reduce swelling and congestion of the tubes themselves.  Although you feel brief relief from the bronchodilator inhaler, your asthma may actually be worsening with the tubes becoming more swollen and filled with mucus.  This can delay other crucial treatments, such as oral steroid medications. If you need to use a bronchodilator inhaler daily, several times per day, you should  discuss this with your provider.  There are probably better treatments that could be used to keep your asthma under control.     HOME CARE Only take medications as instructed by your medical team. Complete the entire course of an antibiotic. Drink plenty of fluids and get plenty of rest. Avoid close contacts especially the very young and the elderly Cover your mouth if you cough or cough into your sleeve. Always remember to wash your hands A steam or ultrasonic humidifier can help congestion.   GET HELP RIGHT AWAY IF: You develop worsening fever. You become short of breath You cough up blood. Your symptoms persist after you have completed your treatment plan MAKE SURE YOU  Understand these instructions. Will watch your condition. Will get help right away if you are not doing well or get worse.  Your e-visit answers were reviewed by a board certified advanced clinical practitioner to complete your personal care plan.  Depending on the condition, your plan could have included both over the counter or prescription medications. If there is a problem please reply  once you have received a response from your provider. Your safety is important to us .  If you have drug allergies check your prescription carefully.    You can use MyChart to ask questions about today's visit, request a non-urgent call back, or ask for a work or school excuse for 24 hours related to this e-Visit. If it has been greater than 24 hours you will need to follow up with your provider, or enter a new e-Visit to address those concerns. You will get an e-mail  in the next two days asking about your experience.  I hope that your e-visit has been valuable and will speed your recovery. Thank you for using e-visits.   I have spent 5 minutes in review of e-visit questionnaire, review and updating patient chart, medical decision making and response to patient.   Delon CHRISTELLA Dickinson, PA-C

## 2024-09-13 ENCOUNTER — Telehealth: Admitting: Family Medicine

## 2024-09-13 DIAGNOSIS — J208 Acute bronchitis due to other specified organisms: Secondary | ICD-10-CM

## 2024-09-13 DIAGNOSIS — J209 Acute bronchitis, unspecified: Secondary | ICD-10-CM

## 2024-09-13 MED ORDER — AZITHROMYCIN 250 MG PO TABS
ORAL_TABLET | ORAL | 0 refills | Status: AC
Start: 2024-09-13 — End: 2024-09-18

## 2024-09-13 MED ORDER — BENZONATATE 200 MG PO CAPS: 200.0000 mg | ORAL_CAPSULE | Freq: Two times a day (BID) | ORAL | 0 refills | Status: DC | PRN

## 2024-09-13 NOTE — Progress Notes (Signed)
 We are sorry that you are not feeling well.  Here is how we plan to help!  Based on your presentation I believe you most likely have A cough due to bacteria.  When patients have a fever and a productive cough with a change in color or increased sputum production, we are concerned about bacterial bronchitis.  If left untreated it can progress to pneumonia.  If your symptoms do not improve with your treatment plan it is important that you contact your provider.   I have prescribed Azithromyin 250 mg: two tablets now and then one tablet daily for 4 additonal days    In addition you may use A prescription cough medication called Tessalon  Perles 100mg . You may take 1-2 capsules every 8 hours as needed for your cough.  From your responses in the eVisit questionnaire you describe inflammation in the upper respiratory tract which is causing a significant cough.  This is commonly called Bronchitis and has four common causes:   Allergies Viral Infections Acid Reflux Bacterial Infection Allergies, viruses and acid reflux are treated by controlling symptoms or eliminating the cause. An example might be a cough caused by taking certain blood pressure medications. You stop the cough by changing the medication. Another example might be a cough caused by acid reflux. Controlling the reflux helps control the cough.  USE OF BRONCHODILATOR (RESCUE) INHALERS: There is a risk from using your bronchodilator too frequently.  The risk is that over-reliance on a medication which only relaxes the muscles surrounding the breathing tubes can reduce the effectiveness of medications prescribed to reduce swelling and congestion of the tubes themselves.  Although you feel brief relief from the bronchodilator inhaler, your asthma may actually be worsening with the tubes becoming more swollen and filled with mucus.  This can delay other crucial treatments, such as oral steroid medications. If you need to use a bronchodilator inhaler  daily, several times per day, you should discuss this with your provider.  There are probably better treatments that could be used to keep your asthma under control.     HOME CARE Only take medications as instructed by your medical team. Complete the entire course of an antibiotic. Drink plenty of fluids and get plenty of rest. Avoid close contacts especially the very young and the elderly Cover your mouth if you cough or cough into your sleeve. Always remember to wash your hands A steam or ultrasonic humidifier can help congestion.   GET HELP RIGHT AWAY IF: You develop worsening fever. You become short of breath You cough up blood. Your symptoms persist after you have completed your treatment plan MAKE SURE YOU  Understand these instructions. Will watch your condition. Will get help right away if you are not doing well or get worse.  Your e-visit answers were reviewed by a board certified advanced clinical practitioner to complete your personal care plan.  Depending on the condition, your plan could have included both over the counter or prescription medications. If there is a problem please reply  once you have received a response from your provider. Your safety is important to us .  If you have drug allergies check your prescription carefully.    You can use MyChart to ask questions about today's visit, request a non-urgent call back, or ask for a work or school excuse for 24 hours related to this e-Visit. If it has been greater than 24 hours you will need to follow up with your provider, or enter a new e-Visit to  address those concerns. You will get an e-mail in the next two days asking about your experience.  I hope that your e-visit has been valuable and will speed your recovery. Thank you for using e-visits.   I have spent 5 minutes in review of e-visit questionnaire, review and updating patient chart, medical decision making and response to patient.   Mart Colpitts, FNP

## 2024-09-15 ENCOUNTER — Encounter

## 2024-12-03 ENCOUNTER — Telehealth: Admitting: Physician Assistant

## 2024-12-03 ENCOUNTER — Encounter: Admitting: Physician Assistant

## 2024-12-03 DIAGNOSIS — J208 Acute bronchitis due to other specified organisms: Secondary | ICD-10-CM

## 2024-12-03 MED ORDER — FLUTICASONE PROPIONATE HFA 110 MCG/ACT IN AERO: 1.0000 | INHALATION_SPRAY | Freq: Two times a day (BID) | RESPIRATORY_TRACT | 0 refills | Status: AC

## 2024-12-03 MED ORDER — ALBUTEROL SULFATE HFA 108 (90 BASE) MCG/ACT IN AERS: 1.0000 | INHALATION_SPRAY | Freq: Four times a day (QID) | RESPIRATORY_TRACT | 0 refills | Status: AC | PRN

## 2024-12-03 MED ORDER — BENZONATATE 100 MG PO CAPS: 100.0000 mg | ORAL_CAPSULE | Freq: Three times a day (TID) | ORAL | 0 refills | Status: AC | PRN

## 2024-12-03 NOTE — Addendum Note (Signed)
 Addended by: VIVIENNE FORTUNATO HERO on: 12/03/2024 08:31 AM   Modules accepted: Orders

## 2024-12-03 NOTE — Progress Notes (Signed)
 Duplicate.   E-visit completed earlier today.   No charge.

## 2024-12-03 NOTE — Progress Notes (Signed)
 We are sorry that you are not feeling well.  Here is how we plan to help!  Based on your presentation I believe you most likely have A cough due to a virus.  This is called viral bronchitis and is best treated by rest, plenty of fluids and control of the cough.  You may use Ibuprofen or Tylenol  as directed to help your symptoms.     In addition you may use A non-prescription cough medication called Mucinex DM: take 2 tablets every 12 hours. and A prescription cough medication called Tessalon  Perles 100mg . You may take 1-2 capsules every 8 hours as needed for your cough.  I have also prescribed Albuterol  inhaler Use 1-2 puffs every 6 hours as needed for shortness of breath, chest tightness, and/or wheezing.  From your responses in the eVisit questionnaire you describe inflammation in the upper respiratory tract which is causing a significant cough.  This is commonly called Bronchitis and has four common causes:   Allergies Viral Infections Acid Reflux Bacterial Infection Allergies, viruses and acid reflux are treated by controlling symptoms or eliminating the cause. An example might be a cough caused by taking certain blood pressure medications. You stop the cough by changing the medication. Another example might be a cough caused by acid reflux. Controlling the reflux helps control the cough.  USE OF BRONCHODILATOR (RESCUE) INHALERS: There is a risk from using your bronchodilator too frequently.  The risk is that over-reliance on a medication which only relaxes the muscles surrounding the breathing tubes can reduce the effectiveness of medications prescribed to reduce swelling and congestion of the tubes themselves.  Although you feel brief relief from the bronchodilator inhaler, your asthma may actually be worsening with the tubes becoming more swollen and filled with mucus.  This can delay other crucial treatments, such as oral steroid medications. If you need to use a bronchodilator inhaler  daily, several times per day, you should discuss this with your provider.  There are probably better treatments that could be used to keep your asthma under control.     HOME CARE Only take medications as instructed by your medical team. Complete the entire course of an antibiotic. Drink plenty of fluids and get plenty of rest. Avoid close contacts especially the very young and the elderly Cover your mouth if you cough or cough into your sleeve. Always remember to wash your hands A steam or ultrasonic humidifier can help congestion.   GET HELP RIGHT AWAY IF: You develop worsening fever. You become short of breath You cough up blood. Your symptoms persist after you have completed your treatment plan MAKE SURE YOU  Understand these instructions. Will watch your condition. Will get help right away if you are not doing well or get worse.  Your e-visit answers were reviewed by a board certified advanced clinical practitioner to complete your personal care plan.  Depending on the condition, your plan could have included both over the counter or prescription medications. If there is a problem please reply  once you have received a response from your provider. Your safety is important to us .  If you have drug allergies check your prescription carefully.    You can use MyChart to ask questions about todays visit, request a non-urgent call back, or ask for a work or school excuse for 24 hours related to this e-Visit. If it has been greater than 24 hours you will need to follow up with your provider, or enter a new e-Visit to address  those concerns. You will get an e-mail in the next two days asking about your experience.  I hope that your e-visit has been valuable and will speed your recovery. Thank you for using e-visits.   I have spent 5 minutes in review of e-visit questionnaire, review and updating patient chart, medical decision making and response to patient.   Delon CHRISTELLA Dickinson,  PA-C

## 2024-12-04 ENCOUNTER — Encounter: Payer: Self-pay | Admitting: Physician Assistant

## 2024-12-25 ENCOUNTER — Encounter: Payer: Self-pay | Admitting: Physician Assistant
